# Patient Record
Sex: Female | Born: 1960
Health system: Southern US, Community
[De-identification: ages and names within clinical notes are randomized; demographics above are authoritative.]

## PROBLEM LIST (undated history)

## (undated) DIAGNOSIS — D649 Anemia, unspecified: Secondary | ICD-10-CM

## (undated) DIAGNOSIS — G8929 Other chronic pain: Secondary | ICD-10-CM

## (undated) DIAGNOSIS — F419 Anxiety disorder, unspecified: Secondary | ICD-10-CM

## (undated) DIAGNOSIS — H409 Unspecified glaucoma: Secondary | ICD-10-CM

## (undated) DIAGNOSIS — K5900 Constipation, unspecified: Secondary | ICD-10-CM

## (undated) DIAGNOSIS — M533 Sacrococcygeal disorders, not elsewhere classified: Secondary | ICD-10-CM

## (undated) DIAGNOSIS — Z789 Other specified health status: Secondary | ICD-10-CM

## (undated) DIAGNOSIS — K219 Gastro-esophageal reflux disease without esophagitis: Secondary | ICD-10-CM

## (undated) DIAGNOSIS — M549 Dorsalgia, unspecified: Secondary | ICD-10-CM

## (undated) DIAGNOSIS — I10 Essential (primary) hypertension: Secondary | ICD-10-CM

## (undated) DIAGNOSIS — R600 Localized edema: Secondary | ICD-10-CM

## (undated) DIAGNOSIS — R350 Frequency of micturition: Secondary | ICD-10-CM

## (undated) DIAGNOSIS — R200 Anesthesia of skin: Secondary | ICD-10-CM

## (undated) DIAGNOSIS — N979 Female infertility, unspecified: Secondary | ICD-10-CM

## (undated) DIAGNOSIS — M25551 Pain in right hip: Secondary | ICD-10-CM

## (undated) HISTORY — DX: Anemia, unspecified: D64.9

## (undated) HISTORY — DX: Sacrococcygeal disorders, not elsewhere classified: M53.3

## (undated) HISTORY — DX: Unspecified glaucoma: H40.9

## (undated) HISTORY — DX: Gastro-esophageal reflux disease without esophagitis: K21.9

## (undated) HISTORY — DX: Essential (primary) hypertension: I10

## (undated) HISTORY — DX: Other chronic pain: G89.29

## (undated) HISTORY — DX: Localized edema: R60.0

## (undated) HISTORY — DX: Frequency of micturition: R35.0

## (undated) HISTORY — DX: Anxiety disorder, unspecified: F41.9

## (undated) HISTORY — PX: DIAGNOSTIC LAPAROSCOPY: SUR761

## (undated) HISTORY — DX: Anesthesia of skin: R20.0

## (undated) HISTORY — PX: WISDOM TOOTH EXTRACTION: SHX21

## (undated) HISTORY — DX: Female infertility, unspecified: N97.9

## (undated) HISTORY — PX: CATARACT EXTRACTION: SUR2

## (undated) HISTORY — DX: Constipation, unspecified: K59.00

## (undated) HISTORY — DX: Pain in right hip: M25.551

---

## 1998-10-12 ENCOUNTER — Encounter: Admission: RE | Admit: 1998-10-12 | Discharge: 1998-10-12 | Payer: Self-pay | Admitting: *Deleted

## 1999-11-02 ENCOUNTER — Other Ambulatory Visit: Admission: RE | Admit: 1999-11-02 | Discharge: 1999-11-02 | Payer: Self-pay | Admitting: Obstetrics and Gynecology

## 1999-12-04 ENCOUNTER — Emergency Department (HOSPITAL_COMMUNITY): Admission: EM | Admit: 1999-12-04 | Discharge: 1999-12-04 | Payer: Self-pay | Admitting: Emergency Medicine

## 2001-03-19 ENCOUNTER — Other Ambulatory Visit: Admission: RE | Admit: 2001-03-19 | Discharge: 2001-03-19 | Payer: Self-pay | Admitting: Obstetrics and Gynecology

## 2001-05-30 ENCOUNTER — Other Ambulatory Visit: Admission: RE | Admit: 2001-05-30 | Discharge: 2001-05-30 | Payer: Self-pay | Admitting: Obstetrics and Gynecology

## 2001-05-30 ENCOUNTER — Encounter (INDEPENDENT_AMBULATORY_CARE_PROVIDER_SITE_OTHER): Payer: Self-pay

## 2001-11-12 ENCOUNTER — Other Ambulatory Visit: Admission: RE | Admit: 2001-11-12 | Discharge: 2001-11-12 | Payer: Self-pay | Admitting: Obstetrics and Gynecology

## 2002-03-25 ENCOUNTER — Other Ambulatory Visit: Admission: RE | Admit: 2002-03-25 | Discharge: 2002-03-25 | Payer: Self-pay | Admitting: Obstetrics and Gynecology

## 2002-09-07 ENCOUNTER — Encounter: Payer: Self-pay | Admitting: Obstetrics and Gynecology

## 2002-09-07 ENCOUNTER — Encounter: Admission: RE | Admit: 2002-09-07 | Discharge: 2002-09-07 | Payer: Self-pay | Admitting: Obstetrics and Gynecology

## 2002-09-07 ENCOUNTER — Other Ambulatory Visit: Admission: RE | Admit: 2002-09-07 | Discharge: 2002-09-07 | Payer: Self-pay | Admitting: Obstetrics and Gynecology

## 2003-04-23 ENCOUNTER — Other Ambulatory Visit: Admission: RE | Admit: 2003-04-23 | Discharge: 2003-04-23 | Payer: Self-pay | Admitting: Obstetrics and Gynecology

## 2003-10-08 ENCOUNTER — Encounter: Payer: Self-pay | Admitting: Obstetrics and Gynecology

## 2003-10-08 ENCOUNTER — Encounter: Admission: RE | Admit: 2003-10-08 | Discharge: 2003-10-08 | Payer: Self-pay | Admitting: Obstetrics and Gynecology

## 2004-02-16 ENCOUNTER — Other Ambulatory Visit: Admission: RE | Admit: 2004-02-16 | Discharge: 2004-02-16 | Payer: Self-pay | Admitting: Obstetrics and Gynecology

## 2004-04-07 ENCOUNTER — Other Ambulatory Visit: Admission: RE | Admit: 2004-04-07 | Discharge: 2004-04-07 | Payer: Self-pay | Admitting: Obstetrics and Gynecology

## 2004-10-09 ENCOUNTER — Encounter: Admission: RE | Admit: 2004-10-09 | Discharge: 2004-10-09 | Payer: Self-pay | Admitting: Obstetrics and Gynecology

## 2005-11-09 ENCOUNTER — Encounter: Admission: RE | Admit: 2005-11-09 | Discharge: 2005-11-09 | Payer: Self-pay | Admitting: Obstetrics and Gynecology

## 2006-11-11 ENCOUNTER — Encounter: Admission: RE | Admit: 2006-11-11 | Discharge: 2006-11-11 | Payer: Self-pay | Admitting: Obstetrics and Gynecology

## 2008-01-07 ENCOUNTER — Encounter: Admission: RE | Admit: 2008-01-07 | Discharge: 2008-01-07 | Payer: Self-pay | Admitting: Obstetrics and Gynecology

## 2009-01-07 ENCOUNTER — Encounter: Admission: RE | Admit: 2009-01-07 | Discharge: 2009-01-07 | Payer: Self-pay | Admitting: Obstetrics and Gynecology

## 2009-12-31 HISTORY — PX: DILATION AND CURETTAGE OF UTERUS: SHX78

## 2010-01-09 ENCOUNTER — Encounter: Admission: RE | Admit: 2010-01-09 | Discharge: 2010-01-09 | Payer: Self-pay | Admitting: Obstetrics and Gynecology

## 2010-08-10 ENCOUNTER — Ambulatory Visit (HOSPITAL_COMMUNITY): Admission: RE | Admit: 2010-08-10 | Discharge: 2010-08-10 | Payer: Self-pay | Admitting: Obstetrics and Gynecology

## 2011-01-11 ENCOUNTER — Encounter
Admission: RE | Admit: 2011-01-11 | Discharge: 2011-01-11 | Payer: Self-pay | Source: Home / Self Care | Attending: Obstetrics and Gynecology | Admitting: Obstetrics and Gynecology

## 2011-01-21 ENCOUNTER — Encounter: Payer: Self-pay | Admitting: Obstetrics and Gynecology

## 2011-03-16 LAB — PREGNANCY, URINE

## 2011-03-16 LAB — CBC
HCT: 38.6 % (ref 36.0–46.0)
Hemoglobin: 13 g/dL (ref 12.0–15.0)
MCHC: 33.6 g/dL (ref 30.0–36.0)
RBC: 4.22 MIL/uL (ref 3.87–5.11)
WBC: 6.6 10*3/uL (ref 4.0–10.5)

## 2012-01-07 ENCOUNTER — Other Ambulatory Visit: Payer: Self-pay | Admitting: Obstetrics and Gynecology

## 2012-01-07 DIAGNOSIS — Z1231 Encounter for screening mammogram for malignant neoplasm of breast: Secondary | ICD-10-CM

## 2012-01-23 ENCOUNTER — Ambulatory Visit
Admission: RE | Admit: 2012-01-23 | Discharge: 2012-01-23 | Disposition: A | Discharge status: Home / Self Care | Payer: BC Managed Care – PPO | Source: Ambulatory Visit | Attending: Obstetrics and Gynecology | Admitting: Obstetrics and Gynecology

## 2012-01-23 DIAGNOSIS — Z1231 Encounter for screening mammogram for malignant neoplasm of breast: Secondary | ICD-10-CM

## 2012-12-30 ENCOUNTER — Other Ambulatory Visit: Payer: Self-pay | Admitting: Obstetrics and Gynecology

## 2012-12-30 DIAGNOSIS — Z1231 Encounter for screening mammogram for malignant neoplasm of breast: Secondary | ICD-10-CM

## 2013-01-01 ENCOUNTER — Encounter (HOSPITAL_COMMUNITY): Payer: Self-pay

## 2013-01-01 ENCOUNTER — Encounter (HOSPITAL_COMMUNITY)
Admission: RE | Admit: 2013-01-01 | Discharge: 2013-01-01 | Disposition: A | Discharge status: Home / Self Care | Payer: BC Managed Care – PPO | Source: Ambulatory Visit | Attending: Obstetrics and Gynecology | Admitting: Obstetrics and Gynecology

## 2013-01-01 HISTORY — DX: Other specified health status: Z78.9

## 2013-01-01 LAB — SURGICAL PCR SCREEN
MRSA, PCR: NEGATIVE
Staphylococcus aureus: NEGATIVE

## 2013-01-01 LAB — CBC
MCH: 30.1 pg (ref 26.0–34.0)
MCHC: 32.3 g/dL (ref 30.0–36.0)
Platelets: 191 10*3/uL (ref 150–400)
RBC: 4.09 MIL/uL (ref 3.87–5.11)

## 2013-01-01 NOTE — Patient Instructions (Addendum)
   Your procedure is scheduled WU:[id number] January 9th  Enter through the Main Entrance of [facility] at:11:30am Pick up the phone at the desk and dial [phone] and inform us of your arrival.  Please call this number if you have any problems the morning of surgery: [phone]  Remember: Do not eat food after midnight on Wednesday You may have clear liquids until 9am on Thursday then nothing   Do not wear jewelry, make-up, or FINGER nail polish No metal in your hair or on your body. Do not wear lotions, powders, perfumes. You may wear deodorant.  Please use your CHG wash as directed prior to surgery.  Do not shave anywhere for at least 12 hours prior to first CHG shower.  Do not bring valuables to the hospital. Contacts may not be worn into surgery.  Leave suitcase in the car. After Surgery it may be brought to your room. For patients being admitted to the hospital, checkout time is 11:00am the day of discharge.  Patients discharged on the day of surgery will not be allowed to drive home.

## 2013-01-02 ENCOUNTER — Other Ambulatory Visit: Payer: Self-pay | Admitting: Obstetrics and Gynecology

## 2013-01-07 MED ORDER — CLINDAMYCIN PHOSPHATE 900 MG/50ML IV SOLN
900.0000 mg | INTRAVENOUS | Status: AC
  Administered 2013-01-08: 900 mg via INTRAVENOUS
  Filled 2013-01-07: qty 50

## 2013-01-07 MED ORDER — CIPROFLOXACIN IN D5W 400 MG/200ML IV SOLN
400.0000 mg | INTRAVENOUS | Status: AC
  Administered 2013-01-08: 400 mg via INTRAVENOUS
  Filled 2013-01-07: qty 200

## 2013-01-08 ENCOUNTER — Encounter (HOSPITAL_COMMUNITY): Payer: Self-pay | Admitting: Anesthesiology

## 2013-01-08 ENCOUNTER — Encounter (HOSPITAL_COMMUNITY): Payer: Self-pay

## 2013-01-08 ENCOUNTER — Encounter (HOSPITAL_COMMUNITY)
Admission: RE | Disposition: A | Discharge status: Home / Self Care | Payer: Self-pay | Source: Ambulatory Visit | Attending: Obstetrics and Gynecology

## 2013-01-08 ENCOUNTER — Ambulatory Visit (HOSPITAL_COMMUNITY): Payer: BC Managed Care – PPO | Admitting: Anesthesiology

## 2013-01-08 ENCOUNTER — Ambulatory Visit (HOSPITAL_COMMUNITY)
Admission: RE | Admit: 2013-01-08 | Discharge: 2013-01-09 | Disposition: A | Discharge status: Home / Self Care | Payer: BC Managed Care – PPO | Source: Ambulatory Visit | Attending: Obstetrics and Gynecology | Admitting: Obstetrics and Gynecology

## 2013-01-08 DIAGNOSIS — N8000 Endometriosis of the uterus, unspecified: Secondary | ICD-10-CM | POA: Insufficient documentation

## 2013-01-08 DIAGNOSIS — IMO0002 Reserved for concepts with insufficient information to code with codable children: Secondary | ICD-10-CM | POA: Diagnosis present

## 2013-01-08 DIAGNOSIS — Z90710 Acquired absence of both cervix and uterus: Secondary | ICD-10-CM

## 2013-01-08 DIAGNOSIS — D252 Subserosal leiomyoma of uterus: Secondary | ICD-10-CM | POA: Insufficient documentation

## 2013-01-08 DIAGNOSIS — D251 Intramural leiomyoma of uterus: Secondary | ICD-10-CM | POA: Insufficient documentation

## 2013-01-08 DIAGNOSIS — N920 Excessive and frequent menstruation with regular cycle: Secondary | ICD-10-CM | POA: Insufficient documentation

## 2013-01-08 HISTORY — PX: ROBOTIC ASSISTED TOTAL HYSTERECTOMY: SHX6085

## 2013-01-08 HISTORY — PX: BILATERAL SALPINGECTOMY: SHX5743

## 2013-01-08 LAB — BASIC METABOLIC PANEL
CO2: 24 mEq/L (ref 19–32)
Calcium: 9.4 mg/dL (ref 8.4–10.5)
Creatinine, Ser: 0.85 mg/dL (ref 0.50–1.10)
GFR calc non Af Amer: 79 mL/min — ABNORMAL LOW (ref >90)

## 2013-01-08 SURGERY — ROBOTIC ASSISTED TOTAL HYSTERECTOMY
Anesthesia: General | Site: Abdomen | Wound class: Clean Contaminated

## 2013-01-08 MED ORDER — LACTATED RINGERS IR SOLN
Status: DC | PRN
  Administered 2013-01-08: 3000 mL

## 2013-01-08 MED ORDER — ONDANSETRON HCL 4 MG/2ML IJ SOLN
4.0000 mg | Freq: Four times a day (QID) | INTRAMUSCULAR | Status: DC | PRN
  Administered 2013-01-08: 4 mg via INTRAVENOUS

## 2013-01-08 MED ORDER — BUPIVACAINE HCL (PF) 0.25 % IJ SOLN
INTRAMUSCULAR | Status: AC
  Filled 2013-01-08: qty 30

## 2013-01-08 MED ORDER — ARTIFICIAL TEARS OP OINT
TOPICAL_OINTMENT | OPHTHALMIC | Status: AC
  Filled 2013-01-08: qty 3.5

## 2013-01-08 MED ORDER — NALOXONE HCL 0.4 MG/ML IJ SOLN: 0.4000 mg | INTRAMUSCULAR | Status: DC | PRN

## 2013-01-08 MED ORDER — ROCURONIUM BROMIDE 100 MG/10ML IV SOLN
INTRAVENOUS | Status: DC | PRN
  Administered 2013-01-08: 50 mg via INTRAVENOUS
  Administered 2013-01-08: 20 mg via INTRAVENOUS
  Administered 2013-01-08: 5 mg via INTRAVENOUS

## 2013-01-08 MED ORDER — OXYCODONE-ACETAMINOPHEN 5-325 MG PO TABS: 1.0000 | ORAL_TABLET | ORAL | Status: DC | PRN

## 2013-01-08 MED ORDER — LIDOCAINE HCL (CARDIAC) 20 MG/ML IV SOLN
INTRAVENOUS | Status: AC
  Filled 2013-01-08: qty 5

## 2013-01-08 MED ORDER — MENTHOL 3 MG MT LOZG
1.0000 | LOZENGE | OROMUCOSAL | Status: DC | PRN
  Filled 2013-01-08: qty 9

## 2013-01-08 MED ORDER — HYDROMORPHONE 0.3 MG/ML IV SOLN
INTRAVENOUS | Status: DC
  Administered 2013-01-08: 20:00:00 via INTRAVENOUS
  Administered 2013-01-08: 0.2 mg via INTRAVENOUS
  Administered 2013-01-09: 0.4 mg via INTRAVENOUS
  Administered 2013-01-09: 3.33 mL via INTRAVENOUS
  Administered 2013-01-09: 0.3999 mg via INTRAVENOUS
  Filled 2013-01-08: qty 25

## 2013-01-08 MED ORDER — PROPOFOL 10 MG/ML IV EMUL
INTRAVENOUS | Status: DC | PRN
  Administered 2013-01-08: 100 mg via INTRAVENOUS
  Administered 2013-01-08: 200 mg via INTRAVENOUS

## 2013-01-08 MED ORDER — DEXTROSE IN LACTATED RINGERS 5 % IV SOLN
INTRAVENOUS | Status: DC
  Administered 2013-01-08: 21:00:00 via INTRAVENOUS

## 2013-01-08 MED ORDER — BUPIVACAINE HCL (PF) 0.25 % IJ SOLN
INTRAMUSCULAR | Status: DC | PRN
  Administered 2013-01-08: 10 mL

## 2013-01-08 MED ORDER — HYDROMORPHONE HCL PF 1 MG/ML IJ SOLN: 0.2000 mg | INTRAMUSCULAR | Status: DC | PRN

## 2013-01-08 MED ORDER — MEPERIDINE HCL 25 MG/ML IJ SOLN: 6.2500 mg | INTRAMUSCULAR | Status: DC | PRN

## 2013-01-08 MED ORDER — PROMETHAZINE HCL 25 MG/ML IJ SOLN: 6.2500 mg | INTRAMUSCULAR | Status: DC | PRN

## 2013-01-08 MED ORDER — FENTANYL CITRATE 0.05 MG/ML IJ SOLN: 25.0000 ug | INTRAMUSCULAR | Status: DC | PRN

## 2013-01-08 MED ORDER — ROCURONIUM BROMIDE 50 MG/5ML IV SOLN
INTRAVENOUS | Status: AC
  Filled 2013-01-08: qty 1

## 2013-01-08 MED ORDER — FENTANYL CITRATE 0.05 MG/ML IJ SOLN
INTRAMUSCULAR | Status: AC
  Filled 2013-01-08: qty 5

## 2013-01-08 MED ORDER — CIPROFLOXACIN IN D5W 400 MG/200ML IV SOLN
400.0000 mg | Freq: Two times a day (BID) | INTRAVENOUS | Status: AC
  Administered 2013-01-09: 400 mg via INTRAVENOUS
  Filled 2013-01-08: qty 200

## 2013-01-08 MED ORDER — HYDROMORPHONE HCL PF 1 MG/ML IJ SOLN
INTRAMUSCULAR | Status: AC
  Filled 2013-01-08: qty 1

## 2013-01-08 MED ORDER — ACETAMINOPHEN 10 MG/ML IV SOLN
1000.0000 mg | Freq: Four times a day (QID) | INTRAVENOUS | Status: DC
  Administered 2013-01-08 – 2013-01-09 (×2): 1000 mg via INTRAVENOUS
  Filled 2013-01-08 (×4): qty 100

## 2013-01-08 MED ORDER — ONDANSETRON HCL 4 MG/2ML IJ SOLN
INTRAMUSCULAR | Status: DC | PRN
  Administered 2013-01-08: 4 mg via INTRAVENOUS

## 2013-01-08 MED ORDER — MIDAZOLAM HCL 2 MG/2ML IJ SOLN
INTRAMUSCULAR | Status: AC
  Filled 2013-01-08: qty 2

## 2013-01-08 MED ORDER — ZOLPIDEM TARTRATE 5 MG PO TABS: 5.0000 mg | ORAL_TABLET | Freq: Every evening | ORAL | Status: DC | PRN

## 2013-01-08 MED ORDER — CLINDAMYCIN PHOSPHATE 900 MG/50ML IV SOLN
900.0000 mg | Freq: Three times a day (TID) | INTRAVENOUS | Status: AC
  Administered 2013-01-08: 900 mg via INTRAVENOUS
  Filled 2013-01-08: qty 50

## 2013-01-08 MED ORDER — PROPOFOL 10 MG/ML IV EMUL
INTRAVENOUS | Status: AC
  Filled 2013-01-08: qty 20

## 2013-01-08 MED ORDER — GLYCOPYRROLATE 0.2 MG/ML IJ SOLN
INTRAMUSCULAR | Status: AC
  Filled 2013-01-08: qty 2

## 2013-01-08 MED ORDER — NEOSTIGMINE METHYLSULFATE 1 MG/ML IJ SOLN
INTRAMUSCULAR | Status: DC | PRN
  Administered 2013-01-08: 3 mg via INTRAVENOUS

## 2013-01-08 MED ORDER — DIPHENHYDRAMINE HCL 12.5 MG/5ML PO ELIX: 12.5000 mg | ORAL_SOLUTION | Freq: Four times a day (QID) | ORAL | Status: DC | PRN

## 2013-01-08 MED ORDER — FENTANYL CITRATE 0.05 MG/ML IJ SOLN
INTRAMUSCULAR | Status: DC | PRN
  Administered 2013-01-08: 150 ug via INTRAVENOUS
  Administered 2013-01-08: 100 ug via INTRAVENOUS

## 2013-01-08 MED ORDER — GLYCOPYRROLATE 0.2 MG/ML IJ SOLN
INTRAMUSCULAR | Status: DC | PRN
  Administered 2013-01-08: 0.4 mg via INTRAVENOUS

## 2013-01-08 MED ORDER — LIDOCAINE HCL (CARDIAC) 20 MG/ML IV SOLN
INTRAVENOUS | Status: DC | PRN
  Administered 2013-01-08: 80 mg via INTRAVENOUS

## 2013-01-08 MED ORDER — SODIUM CHLORIDE 0.9 % IJ SOLN: 9.0000 mL | INTRAMUSCULAR | Status: DC | PRN

## 2013-01-08 MED ORDER — MIDAZOLAM HCL 5 MG/5ML IJ SOLN
INTRAMUSCULAR | Status: DC | PRN
  Administered 2013-01-08: 2 mg via INTRAVENOUS

## 2013-01-08 MED ORDER — ONDANSETRON HCL 4 MG PO TABS: 4.0000 mg | ORAL_TABLET | Freq: Four times a day (QID) | ORAL | Status: DC | PRN

## 2013-01-08 MED ORDER — LACTATED RINGERS IV SOLN
INTRAVENOUS | Status: DC
  Administered 2013-01-08 (×3): via INTRAVENOUS

## 2013-01-08 MED ORDER — ACETAMINOPHEN 10 MG/ML IV SOLN
INTRAVENOUS | Status: AC
  Filled 2013-01-08: qty 100

## 2013-01-08 MED ORDER — PANTOPRAZOLE SODIUM 40 MG PO TBEC
40.0000 mg | DELAYED_RELEASE_TABLET | Freq: Every day | ORAL | Status: DC
  Administered 2013-01-09: 40 mg via ORAL
  Filled 2013-01-08 (×3): qty 1

## 2013-01-08 MED ORDER — DIPHENHYDRAMINE HCL 50 MG/ML IJ SOLN: 12.5000 mg | Freq: Four times a day (QID) | INTRAMUSCULAR | Status: DC | PRN

## 2013-01-08 MED ORDER — ACETAMINOPHEN 10 MG/ML IV SOLN
1000.0000 mg | Freq: Four times a day (QID) | INTRAVENOUS | Status: DC
  Administered 2013-01-08: 1000 mg via INTRAVENOUS

## 2013-01-08 MED ORDER — MIDAZOLAM HCL 2 MG/2ML IJ SOLN: 0.5000 mg | Freq: Once | INTRAMUSCULAR | Status: DC | PRN

## 2013-01-08 MED ORDER — HYDROMORPHONE HCL PF 1 MG/ML IJ SOLN
INTRAMUSCULAR | Status: DC | PRN
  Administered 2013-01-08 (×2): 1 mg via INTRAVENOUS

## 2013-01-08 MED ORDER — ONDANSETRON HCL 4 MG/2ML IJ SOLN
INTRAMUSCULAR | Status: AC
Start: 2013-01-08 — End: 2013-01-08
  Filled 2013-01-08: qty 2

## 2013-01-08 MED ORDER — ONDANSETRON HCL 4 MG/2ML IJ SOLN
INTRAMUSCULAR | Status: AC
  Administered 2013-01-08: 4 mg via INTRAVENOUS
  Filled 2013-01-08: qty 2

## 2013-01-08 MED ORDER — NEOSTIGMINE METHYLSULFATE 1 MG/ML IJ SOLN
INTRAMUSCULAR | Status: AC
  Filled 2013-01-08: qty 1

## 2013-01-08 SURGICAL SUPPLY — 64 items
ADH SKN CLS APL DERMABOND .7 (GAUZE/BANDAGES/DRESSINGS) ×2
BAG URINE DRAINAGE (UROLOGICAL SUPPLIES) ×3 IMPLANT
BARRIER ADHS 3X4 INTERCEED (GAUZE/BANDAGES/DRESSINGS) ×2 IMPLANT
BRR ADH 4X3 ABS CNTRL BYND (GAUZE/BANDAGES/DRESSINGS)
CATH FOLEY 3WAY  5CC 16FR (CATHETERS) ×1
CATH FOLEY 3WAY 5CC 16FR (CATHETERS) ×2 IMPLANT
CHLORAPREP W/TINT 26ML (MISCELLANEOUS) ×3 IMPLANT
CLOTH BEACON ORANGE TIMEOUT ST (SAFETY) ×3 IMPLANT
CONT PATH 16OZ SNAP LID 3702 (MISCELLANEOUS) ×3 IMPLANT
COVER MAYO STAND STRL (DRAPES) ×3 IMPLANT
COVER TABLE BACK 60X90 (DRAPES) ×6 IMPLANT
COVER TIP SHEARS 8 DVNC (MISCELLANEOUS) ×2 IMPLANT
COVER TIP SHEARS 8MM DA VINCI (MISCELLANEOUS) ×1
DECANTER SPIKE VIAL GLASS SM (MISCELLANEOUS) ×2 IMPLANT
DERMABOND ADVANCED (GAUZE/BANDAGES/DRESSINGS) ×1
DERMABOND ADVANCED .7 DNX12 (GAUZE/BANDAGES/DRESSINGS) ×2 IMPLANT
DEVICE TROCAR PUNCTURE CLOSURE (ENDOMECHANICALS) IMPLANT
DRAPE HUG U DISPOSABLE (DRAPE) ×3 IMPLANT
DRAPE LG THREE QUARTER DISP (DRAPES) ×6 IMPLANT
DRAPE WARM FLUID 44X44 (DRAPE) ×3 IMPLANT
ELECT REM PT RETURN 9FT ADLT (ELECTROSURGICAL) ×3
ELECTRODE REM PT RTRN 9FT ADLT (ELECTROSURGICAL) ×2 IMPLANT
EVACUATOR SMOKE 8.L (FILTER) ×3 IMPLANT
GAUZE VASELINE 3X9 (GAUZE/BANDAGES/DRESSINGS) IMPLANT
GLOVE BIO SURGEON STRL SZ 6.5 (GLOVE) ×6 IMPLANT
GLOVE BIOGEL PI IND STRL 7.0 (GLOVE) ×6 IMPLANT
GLOVE BIOGEL PI INDICATOR 7.0 (GLOVE) ×3
GOWN STRL REIN XL XLG (GOWN DISPOSABLE) ×18 IMPLANT
KIT ACCESSORY DA VINCI DISP (KITS) ×1
KIT ACCESSORY DVNC DISP (KITS) ×2 IMPLANT
LEGGING LITHOTOMY PAIR STRL (DRAPES) ×3 IMPLANT
NDL INSUFFLATION 14GA 120MM (NEEDLE) ×2 IMPLANT
NEEDLE INSUFFLATION 14GA 120MM (NEEDLE) ×3 IMPLANT
OCCLUDER COLPOPNEUMO (BALLOONS) ×1 IMPLANT
PACK LAVH (CUSTOM PROCEDURE TRAY) ×3 IMPLANT
PAD PREP 24X48 CUFFED NSTRL (MISCELLANEOUS) ×6 IMPLANT
PLUG CATH AND CAP STER (CATHETERS) ×3 IMPLANT
PROTECTOR NERVE ULNAR (MISCELLANEOUS) ×6 IMPLANT
SCISSORS LAP 5X35 DISP (ENDOMECHANICALS) IMPLANT
SET CYSTO W/LG BORE CLAMP LF (SET/KITS/TRAYS/PACK) IMPLANT
SET IRRIG TUBING LAPAROSCOPIC (IRRIGATION / IRRIGATOR) ×3 IMPLANT
SOLUTION ELECTROLUBE (MISCELLANEOUS) ×3 IMPLANT
SUT VIC AB 0 CT1 27 (SUTURE) ×24
SUT VIC AB 0 CT1 27XBRD ANTBC (SUTURE) ×10 IMPLANT
SUT VICRYL 0 UR6 27IN ABS (SUTURE) ×4 IMPLANT
SUT VICRYL 4-0 PS2 18IN ABS (SUTURE) ×6 IMPLANT
SYR 50ML LL SCALE MARK (SYRINGE) ×3 IMPLANT
SYRINGE 10CC LL (SYRINGE) ×3 IMPLANT
SYSTEM CONVERTIBLE TROCAR (TROCAR) IMPLANT
TIP RUMI ORANGE 6.7MMX12CM (TIP) ×1 IMPLANT
TIP UTERINE 5.1X6CM LAV DISP (MISCELLANEOUS) IMPLANT
TIP UTERINE 6.7X10CM GRN DISP (MISCELLANEOUS) IMPLANT
TIP UTERINE 6.7X6CM WHT DISP (MISCELLANEOUS) IMPLANT
TIP UTERINE 6.7X8CM BLUE DISP (MISCELLANEOUS) IMPLANT
TOWEL OR 17X24 6PK STRL BLUE (TOWEL DISPOSABLE) ×9 IMPLANT
TROCAR 12M 150ML BLUNT (TROCAR) IMPLANT
TROCAR DISP BLADELESS 8 DVNC (TROCAR) IMPLANT
TROCAR DISP BLADELESS 8MM (TROCAR) ×1
TROCAR XCEL 12X100 BLDLESS (ENDOMECHANICALS) ×3 IMPLANT
TROCAR XCEL NON-BLD 5MMX100MML (ENDOMECHANICALS) ×3 IMPLANT
TROCAR Z-THREAD 12X150 (TROCAR) ×3 IMPLANT
TUBING FILTER THERMOFLATOR (ELECTROSURGICAL) ×3 IMPLANT
WARMER LAPAROSCOPE (MISCELLANEOUS) ×3 IMPLANT
WATER STERILE IRR 1000ML POUR (IV SOLUTION) ×9 IMPLANT

## 2013-01-08 NOTE — Brief Op Note (Signed)
01/08/2013  6:07 PM  PATIENT:  Veronda Prude  52 y.o. female  PRE-OPERATIVE DIAGNOSIS:  Menorrhagia, Uterine Fibroids. Previous cesarean section  POST-OPERATIVE DIAGNOSIS:  Menorrhagia, fibroids, previous  Cesarean section  PROCEDURE:  DaVinci robotic total hysterectomy, bilateral salpingectomy  SURGEON:  Surgeon(s) and Role:    * Jkayla Spiewak Cathie Beams, MD - Primary  PHYSICIAN ASSISTANT:   ASSISTANTS: Olivia Mackie, M.D   ANESTHESIA:   general FINDINGS: enlarged globular uterus 12-13 weeks size, nl tubes, nl liver edge, ovaries(left w/ CL cyst) nl right ovary  EBL:  Total I/O In: 2000 [I.V.:2000] Out: 550 [Urine:450; Blood:100]  BLOOD ADMINISTERED:none  DRAINS: none   LOCAL MEDICATIONS USED:  MARCAINE     SPECIMEN:  Source of Specimen:  uterus with cervix, tubes bilateral  DISPOSITION OF SPECIMEN:  PATHOLOGY  COUNTS:  YES  TOURNIQUET:  * No tourniquets in log *  DICTATION: .Other Dictation: Dictation Number   PLAN OF CARE: Admit for overnight observation  PATIENT DISPOSITION:  PACU - hemodynamically stable.   Delay start of Pharmacological VTE agent (>24hrs) due to surgical blood loss or risk of bleeding: no

## 2013-01-08 NOTE — Anesthesia Postprocedure Evaluation (Signed)
Anesthesia Post Note  Patient: Tonya Clarke  Procedure(s) Performed: Procedure(s) (LRB): ROBOTIC ASSISTED TOTAL HYSTERECTOMY (N/A) BILATERAL SALPINGECTOMY (Bilateral)  Anesthesia type: General  Patient location: PACU  Post pain: Pain level controlled  Post assessment: Post-op Vital signs reviewed  Last Vitals:  Filed Vitals:   01/08/13 1900  BP:   Pulse: 102  Temp: 36.9 C  Resp: 33    Post vital signs: Reviewed  Level of consciousness: sedated  Complications: No apparent anesthesia complications

## 2013-01-08 NOTE — Transfer of Care (Signed)
Immediate Anesthesia Transfer of Care Note  Patient: Tonya Clarke  Procedure(s) Performed: Procedure(s) (LRB) with comments: ROBOTIC ASSISTED TOTAL HYSTERECTOMY (N/A) - 3 hrs. BILATERAL SALPINGECTOMY (Bilateral)  Patient Location: PACU  Anesthesia Type:General  Level of Consciousness: awake, alert  and oriented  Airway & Oxygen Therapy: Patient Spontanous Breathing and Patient connected to nasal cannula oxygen  Post-op Assessment: Report given to PACU RN and Post -op Vital signs reviewed and stable  Post vital signs: stable  Complications: No apparent anesthesia complications

## 2013-01-08 NOTE — Anesthesia Preprocedure Evaluation (Signed)
Anesthesia Evaluation  Patient identified by MRN, date of birth, ID band Patient awake    Reviewed: Allergy & Precautions, H&P , Patient's Chart, lab work & pertinent test results, reviewed documented beta blocker date and time   History of Anesthesia Complications Negative for: history of anesthetic complications  Airway Mallampati: III TM Distance: >3 FB Neck ROM: full    Dental No notable dental hx.    Pulmonary neg pulmonary ROS,  breath sounds clear to auscultation  Pulmonary exam normal       Cardiovascular Exercise Tolerance: Good negative cardio ROS  Rhythm:regular Rate:Normal     Neuro/Psych negative neurological ROS  negative psych ROS   GI/Hepatic negative GI ROS, Neg liver ROS,   Endo/Other  negative endocrine ROS  Renal/GU negative Renal ROS     Musculoskeletal   Abdominal   Peds  Hematology negative hematology ROS (+)   Anesthesia Other Findings   Reproductive/Obstetrics negative OB ROS                           Anesthesia Physical Anesthesia Plan  ASA: II  Anesthesia Plan: General ETT   Post-op Pain Management:    Induction:   Airway Management Planned:   Additional Equipment:   Intra-op Plan:   Post-operative Plan:   Informed Consent: I have reviewed the patients History and Physical, chart, labs and discussed the procedure including the risks, benefits and alternatives for the proposed anesthesia with the patient or authorized representative who has indicated his/her understanding and acceptance.   Dental Advisory Given  Plan Discussed with: CRNA and Surgeon  Anesthesia Plan Comments:         Anesthesia Quick Evaluation

## 2013-01-09 ENCOUNTER — Encounter (HOSPITAL_COMMUNITY): Payer: Self-pay | Admitting: Obstetrics and Gynecology

## 2013-01-09 DIAGNOSIS — IMO0002 Reserved for concepts with insufficient information to code with codable children: Secondary | ICD-10-CM | POA: Diagnosis present

## 2013-01-09 LAB — CBC
Hemoglobin: 12.3 g/dL (ref 12.0–15.0)
MCH: 29.9 pg (ref 26.0–34.0)
MCHC: 32.6 g/dL (ref 30.0–36.0)
MCV: 91.7 fL (ref 78.0–100.0)

## 2013-01-09 LAB — BASIC METABOLIC PANEL
BUN: 5 mg/dL — ABNORMAL LOW (ref 6–23)
CO2: 27 mEq/L (ref 19–32)
GFR calc non Af Amer: 84 mL/min — ABNORMAL LOW (ref >90)
Glucose, Bld: 130 mg/dL — ABNORMAL HIGH (ref 70–99)
Potassium: 4 mEq/L (ref 3.5–5.1)

## 2013-01-09 MED ORDER — OXYCODONE-ACETAMINOPHEN 5-325 MG PO TABS
1.0000 | ORAL_TABLET | ORAL | Status: DC | PRN
Start: 2013-01-09 — End: 2018-11-03

## 2013-01-09 NOTE — Progress Notes (Signed)
Subjective: Patient reports tolerating PO and no problems voiding.    Objective: I have reviewed patient's vital signs.  vital signs, intake and output and labs. Filed Vitals:   01/09/13 0515  BP: 121/80  Pulse: 74  Temp: 98.4 F (36.9 C)  Resp: 18   I/O last 3 completed shifts: In: 4930 [P.O.:300; I.V.:4430; IV Piggyback:200] Out: 3550 [Urine:3450; Blood:100] Total I/O In: -  Out: 350 [Urine:350]  Lab Results  Component Value Date   WBC 12.5* 01/09/2013   HGB 12.3 01/09/2013   HCT 37.7 01/09/2013   MCV 91.7 01/09/2013   PLT 175 01/09/2013   Lab Results  Component Value Date   CREATININE 0.81 01/09/2013    EXAM General: alert, cooperative and no distress Resp: clear to auscultation bilaterally Cardio: regular rate and rhythm, S1, S2 normal, no murmur, click, rub or gallop GI: soft, non-tender; bowel sounds normal; no masses,  no organomegaly Extremities: no edema, redness or tenderness in the calves or thighs Vaginal Bleeding: none  Assessment: s/p Procedure(s): ROBOTIC ASSISTED TOTAL HYSTERECTOMY BILATERAL SALPINGECTOMY: stable, progressing well and tolerating diet  Plan: Advance diet Encourage ambulation Advance to PO medication Discontinue IV fluids Discharge home D/c instructions reviewed  LOS: 1 day    Erilyn Pearman A, MD 01/09/2013 8:56 AM    01/09/2013, 8:56 AM

## 2013-01-09 NOTE — Addendum Note (Signed)
Addendum  created 01/09/13 0836 by Earmon Phoenix, CRNA   Modules edited:Notes Section

## 2013-01-09 NOTE — Anesthesia Postprocedure Evaluation (Signed)
  Anesthesia Post-op Note  Patient: Tonya Clarke  Procedure(s) Performed: Procedure(s) (LRB) with comments: ROBOTIC ASSISTED TOTAL HYSTERECTOMY (N/A) - 3 hrs. BILATERAL SALPINGECTOMY (Bilateral)  Patient Location: [facility]  Anesthesia Type:General  Level of Consciousness: awake, alert  and oriented  Airway and Oxygen Therapy: Patient Spontanous Breathing  Post-op Pain: mild  Post-op Assessment: Patient's Cardiovascular Status Stable, Respiratory Function Stable, No signs of Nausea or vomiting and Pain level controlled  Post-op Vital Signs: stable  Complications: No apparent anesthesia complications

## 2013-01-09 NOTE — Discharge Summary (Signed)
Physician Discharge Summary  Patient ID: RIVER MCKERCHER MRN: [id number] DOB/AGE: 1961-02-20 52 y.o.  Admit date: 01/08/2013 Discharge date: 01/09/2013  Admission Diagnoses: menorrhagia, uterine fibroid, previous cesarean section  Discharge Diagnoses: menorrhagia, uterine fibroid, previous cesarean section Active Problems:  * No active hospital problems. *    Discharged Condition: stable  Hospital Course: pt was admitted to [facility] where she underwent daVinci robotic total hysterectomy, bilateral salpingectomy. Uncomplicated postop course. Tolerated reg diet  Consults: None  Significant Diagnostic Studies: labs: hgb 12.5    Creatinine 0.81 wbc 12.3  Treatments: surgery: daVinci robotic total hysterectomy, bilateral salpingectomy  Discharge Exam: Blood pressure 121/80, pulse 74, temperature 98.4 F (36.9 C), temperature source Oral, resp. rate 18, height 5\' 2"  (1.575 m), weight 87.091 kg (192 lb), SpO2 100.00%. General appearance: alert, cooperative and no distress Resp: clear to auscultation bilaterally Cardio: regular rate and rhythm, S1, S2 normal, no murmur, click, rub or gallop GI: soft, non-tender; bowel sounds normal; no masses,  no organomegaly Pelvic: deferred Extremities: no edema, redness or tenderness in the calves or thighs Incision/Wound:robotic incisions well approximated  Disposition: Final discharge disposition not confirmed  Discharge Orders    Future Appointments: Provider: Department: Dept Phone: Center:   02/20/2013 8:00 AM [facility]  IMAGING [phone] [facility]     Future Orders Please Complete By Expires   Diet general      Increase activity slowly      Discharge instructions      Comments:   Call if temperature greater than equal to 100.4, nothing per vagina for 4-6 weeks or severe nausea vomiting, increased incisional pain , drainage or redness in the incision site, no straining with bowel movements, showers no bath     No wound care      No dressing needed      Discharge patient          Medication List     As of 01/09/2013  8:59 AM    TAKE these medications         acetaminophen 500 MG tablet   Commonly known as: TYLENOL   Take 500 mg by mouth every 6 (six) hours as needed. pain      oxyCODONE-acetaminophen 5-325 MG per tablet   Commonly known as: PERCOCET/ROXICET   Take 1-2 tablets by mouth every 4 (four) hours as needed (moderate to severe pain (when tolerating fluids)).           Follow-up Information    Follow up with Michell Giuliano A, MD. In 2 weeks.   Contact information:   [address] [place] [address] [phone]          Signed: Monchel Pollitt A 01/09/2013, 8:59 AM

## 2013-01-09 NOTE — Op Note (Signed)
Tonya Clarke, Tonya Clarke            ACCOUNT NO.:  [id number]  MEDICAL RECORD NO.:  [id number]  LOCATION:                                 FACILITY:  PHYSICIAN:  Maxie Better, M.D.DATE OF BIRTH:  Dec 02, 1961  DATE OF PROCEDURE:  01/08/2013 DATE OF DISCHARGE:                              OPERATIVE REPORT   PREOPERATIVE DIAGNOSES:  Menorrhagia, uterine fibroids, previous cesarean section.  PROCEDURE:  Da Vinci robotic total hysterectomy, bilateral salpingectomy.  POSTOPERATIVE DIAGNOSES:  Menorrhagia, uterine fibroids. Previous cesarean section.  ANESTHESIA:  General.  SURGEON:  Maxie Better, M.D.  ASSISTANT:  Lenoard Aden, M.D.  PROCEDURE:  Under adequate general anesthesia, the patient was placed in the dorsal lithotomy position.  She was positioned for robotic surgery. Examination under anesthesia revealed a 12-13 week size uterus.  No adnexal masses could be appreciated.  There was a palpable posterior cul- de-sac mass associated with the uterus.  The patient was sterilely prepped and draped in fashion, three-way Foley catheter was sterilely placed.  Retractor  was placed in the vagina.  Cervix had 0 Vicryl figure-of-eight sutures placed on the anterior lip and the posterior lip of the cervix.  The cervix was dilated and uterus sounded to 13 cm.  A #12 uterine manipulator attached to a small KOH ring, Rumi applicator was inserted into the uterus without incident.  The retractors were removed.  Attention was then turned to the abdomen.  A 0.25% Marcaine was injected supraumbilically as  a vertical incision was made.  Veress needle was introduced, tested with normal saline.  Carbon dioxide was insufflated.  Opening pressure of 7 was noted, 2.5 L of CO2 was insufflated.  Veress needle was then removed.  A 12-mm disposable trocar with sleeve was introduced into the abdomen without incident. The robotic camera was introduced.  The patient was placed in  deep Trendelenburg position.  The panoramic inspection noted normal liver edge, atraumatic entry into the abdomen and the uterus that was enlarged, left ovary with corpus luteum appearing cyst.  Right ovary was normal and normal tubes.  Additional robotic port sites were placed, 2 on the left, 10 cm apart, both  8 mm ports on the left.  Right side had a 5- mm assistant port placed in the right lower quadrant and 8-mm robotic ports sites placed in between the robotic camera site and the assistant port.  Once these were placed satisfactorily, the robot was brought to the patient's left side and docked.  The arm one had the monopolar scissors, arm two had the PK dissector and arm 2 had the Prograsp placement.  I then went to the surgical console.  At the surgical console, the hysterectomy was started with the ureters being both seen peristalsing. On the right side.  The retroperitoneal space was opened.  The fallopian tube mesosalpinx was serially clamped, cauterized and cut.  The right utero-ovarian ligament was clamped, cauterized and then cut.  The round ligament was then clamped, cauterized and cut.  The anterior posterior leaf of the broad ligament was then opened and extended towards the bladder flap. The bladder flap was then opened transversely and the bladder was then dissected off the lower uterine segment.  The uterine vessels on the right was skeletonized, clamped, cauterized, but not cut.  Attention was then turned to the opposite side.  The ureter was noted to be peristalsing.  The left retroperitoneal space was opened.  The left fallopian tube was grasped, the mesosalpinx was serially clamped, cauterized, and cut.  The utero-ovarian ligament on the left had some bleeding started which was subsequently clamped and cauterized. Hemostasis was achieved.  The utero-ovarian ligament was severed, the round ligament was then also clamped, cauterized and cut, and the remaining  vesicouterine peritoneum was then opened.  The blood clots were removed.  The uterine vessels were noted on the left and which were cauterized and cut.  Once that was done,  attention was then turned posteriorly, there was poorly developed of the uterosacral ligaments. The  Bladder peritoneum was further moved inferiorly.  Attempt to open the cervicovaginal junction posteriorly was unsuccessful.  Attention then turned back to the right side where the uterine vessels were re- cauterized and cut.  The RUMI was readjusted to make sure that it was right at the cervicovaginal junction.  Once this was done, the anterior incision was then made to sever the uterus from its vaginal attachment.  This incision was then carried around circumferentially taking care again to secure the cup along that junction.  Once this was done, the uterus was removed,  however, the uterus was too large to be brought through the vagina in total, so the uterus was bivalved partially and uterus was then removed through the vagina.  Once this was done, the posterior cuff was noted, small bleeders cauterized.  The PK and the monopolar scissors were replaced by a long-tip forceps and a large needle driver, 0 Vicryl figure-of-eight sutures were then used to close the vaginal cuff.  Once this was done, the cuff was irrigated and suctioned of debris.  The pedicles again were noted to have good hemostasis.  The procedure was felt to be completed at that point, at which time the robotic instruments were removed.  The robot was undocked and then back to the patient's bedside.  The needles from the vaginal cuff closure were then removed.  There was some bleeding noted near the bladder peritoneum and this was cauterized.  The abdomen was then irrigated and  suctioned.  Good hemostasis noted throughout at which time  the robotic port sites were then removed.  The incisions were then closed with 4-0 Vicryl subcuticular stitch of the   small incisions.  The supraumbilical incision had a 0 Vicryl for the fascial closure and 4-0 Vicryl for the skin.  Bimanual vaginal exam was done and the vaginal cuff was well approximated.  SPECIMEN:  Uterus, cervix, fallopian tubes, weighing 221 g sent to pathology.  ESTIMATED BLOOD LOSS:  200 mL.  INTRAOPERATIVE FLUID:  2 L.  URINE OUTPUT:  450 mL clear yellow urine.  COUNTS:  Sponge and instrument counts x2 was correct.  COMPLICATIONS:  None.  The patient tolerated the procedure well, was transferred to recovery in stable condition.     Maxie Better, M.D.     [place]/MEDQ  D:  01/09/2013  T:  01/09/2013  Job:  [id number]

## 2013-01-09 NOTE — Progress Notes (Signed)
Pt  Out in wheelchair   Teaching complete  

## 2013-02-20 ENCOUNTER — Ambulatory Visit
Admission: RE | Admit: 2013-02-20 | Discharge: 2013-02-20 | Disposition: A | Discharge status: Home / Self Care | Payer: BC Managed Care – PPO | Source: Ambulatory Visit | Attending: Obstetrics and Gynecology | Admitting: Obstetrics and Gynecology

## 2013-02-20 ENCOUNTER — Other Ambulatory Visit: Payer: Self-pay | Admitting: Obstetrics and Gynecology

## 2013-02-20 DIAGNOSIS — Z1231 Encounter for screening mammogram for malignant neoplasm of breast: Secondary | ICD-10-CM

## 2013-02-20 DIAGNOSIS — N644 Mastodynia: Secondary | ICD-10-CM

## 2013-03-05 ENCOUNTER — Ambulatory Visit
Admission: RE | Admit: 2013-03-05 | Discharge: 2013-03-05 | Disposition: A | Discharge status: Home / Self Care | Payer: BC Managed Care – PPO | Source: Ambulatory Visit | Attending: Obstetrics and Gynecology | Admitting: Obstetrics and Gynecology

## 2013-03-05 DIAGNOSIS — N644 Mastodynia: Secondary | ICD-10-CM

## 2014-01-27 ENCOUNTER — Other Ambulatory Visit: Payer: Self-pay

## 2014-01-27 DIAGNOSIS — Z1231 Encounter for screening mammogram for malignant neoplasm of breast: Secondary | ICD-10-CM

## 2014-03-08 ENCOUNTER — Ambulatory Visit: Payer: BC Managed Care – PPO

## 2014-03-22 ENCOUNTER — Other Ambulatory Visit: Payer: Self-pay

## 2014-03-22 DIAGNOSIS — Z1231 Encounter for screening mammogram for malignant neoplasm of breast: Secondary | ICD-10-CM

## 2014-03-23 ENCOUNTER — Ambulatory Visit: Payer: BC Managed Care – PPO

## 2014-03-23 ENCOUNTER — Ambulatory Visit
Admission: RE | Admit: 2014-03-23 | Discharge: 2014-03-23 | Disposition: A | Discharge status: Home / Self Care | Payer: BC Managed Care – PPO | Source: Ambulatory Visit

## 2014-03-23 DIAGNOSIS — Z1231 Encounter for screening mammogram for malignant neoplasm of breast: Secondary | ICD-10-CM

## 2014-07-13 ENCOUNTER — Encounter (INDEPENDENT_AMBULATORY_CARE_PROVIDER_SITE_OTHER): Payer: BC Managed Care – PPO | Admitting: Ophthalmology

## 2014-07-13 DIAGNOSIS — H33309 Unspecified retinal break, unspecified eye: Secondary | ICD-10-CM

## 2014-07-13 DIAGNOSIS — H35419 Lattice degeneration of retina, unspecified eye: Secondary | ICD-10-CM

## 2014-07-13 DIAGNOSIS — H43819 Vitreous degeneration, unspecified eye: Secondary | ICD-10-CM

## 2014-07-13 DIAGNOSIS — H2510 Age-related nuclear cataract, unspecified eye: Secondary | ICD-10-CM

## 2014-07-13 DIAGNOSIS — Q143 Congenital malformation of choroid: Secondary | ICD-10-CM

## 2014-07-21 ENCOUNTER — Ambulatory Visit (INDEPENDENT_AMBULATORY_CARE_PROVIDER_SITE_OTHER): Payer: BC Managed Care – PPO | Admitting: Ophthalmology

## 2014-07-21 DIAGNOSIS — H33309 Unspecified retinal break, unspecified eye: Secondary | ICD-10-CM

## 2014-11-22 ENCOUNTER — Ambulatory Visit (INDEPENDENT_AMBULATORY_CARE_PROVIDER_SITE_OTHER): Payer: BC Managed Care – PPO | Admitting: Ophthalmology

## 2014-11-22 DIAGNOSIS — D3131 Benign neoplasm of right choroid: Secondary | ICD-10-CM

## 2014-11-22 DIAGNOSIS — H33302 Unspecified retinal break, left eye: Secondary | ICD-10-CM

## 2014-11-22 DIAGNOSIS — H43813 Vitreous degeneration, bilateral: Secondary | ICD-10-CM

## 2015-02-16 ENCOUNTER — Other Ambulatory Visit: Payer: Self-pay

## 2015-02-16 DIAGNOSIS — Z1231 Encounter for screening mammogram for malignant neoplasm of breast: Secondary | ICD-10-CM

## 2015-03-25 ENCOUNTER — Ambulatory Visit
Admission: RE | Admit: 2015-03-25 | Discharge: 2015-03-25 | Disposition: A | Discharge status: Home / Self Care | Payer: Self-pay | Source: Ambulatory Visit

## 2015-03-25 DIAGNOSIS — Z1231 Encounter for screening mammogram for malignant neoplasm of breast: Secondary | ICD-10-CM

## 2016-02-16 ENCOUNTER — Other Ambulatory Visit: Payer: Self-pay

## 2016-02-16 DIAGNOSIS — Z1231 Encounter for screening mammogram for malignant neoplasm of breast: Secondary | ICD-10-CM

## 2016-03-26 ENCOUNTER — Ambulatory Visit
Admission: RE | Admit: 2016-03-26 | Discharge: 2016-03-26 | Disposition: A | Discharge status: Home / Self Care | Payer: BLUE CROSS/BLUE SHIELD | Source: Ambulatory Visit

## 2016-03-26 DIAGNOSIS — Z1231 Encounter for screening mammogram for malignant neoplasm of breast: Secondary | ICD-10-CM

## 2017-02-22 ENCOUNTER — Other Ambulatory Visit: Payer: Self-pay | Admitting: Obstetrics and Gynecology

## 2017-02-22 DIAGNOSIS — Z1231 Encounter for screening mammogram for malignant neoplasm of breast: Secondary | ICD-10-CM

## 2017-03-27 ENCOUNTER — Ambulatory Visit
Admission: RE | Admit: 2017-03-27 | Discharge: 2017-03-27 | Disposition: A | Discharge status: Home / Self Care | Payer: BLUE CROSS/BLUE SHIELD | Source: Ambulatory Visit | Attending: Obstetrics and Gynecology | Admitting: Obstetrics and Gynecology

## 2017-03-27 DIAGNOSIS — Z1231 Encounter for screening mammogram for malignant neoplasm of breast: Secondary | ICD-10-CM

## 2018-02-24 ENCOUNTER — Other Ambulatory Visit: Payer: Self-pay | Admitting: Obstetrics and Gynecology

## 2018-02-24 DIAGNOSIS — Z1231 Encounter for screening mammogram for malignant neoplasm of breast: Secondary | ICD-10-CM

## 2018-03-31 ENCOUNTER — Ambulatory Visit
Admission: RE | Admit: 2018-03-31 | Discharge: 2018-03-31 | Disposition: A | Discharge status: Home / Self Care | Payer: BLUE CROSS/BLUE SHIELD | Source: Ambulatory Visit | Attending: Obstetrics and Gynecology | Admitting: Obstetrics and Gynecology

## 2018-03-31 DIAGNOSIS — Z1231 Encounter for screening mammogram for malignant neoplasm of breast: Secondary | ICD-10-CM

## 2018-07-10 DIAGNOSIS — I10 Essential (primary) hypertension: Secondary | ICD-10-CM | POA: Diagnosis not present

## 2018-07-30 DIAGNOSIS — H04123 Dry eye syndrome of bilateral lacrimal glands: Secondary | ICD-10-CM | POA: Diagnosis not present

## 2018-07-30 DIAGNOSIS — H401231 Low-tension glaucoma, bilateral, mild stage: Secondary | ICD-10-CM | POA: Diagnosis not present

## 2018-07-30 DIAGNOSIS — H0100A Unspecified blepharitis right eye, upper and lower eyelids: Secondary | ICD-10-CM | POA: Diagnosis not present

## 2018-07-30 DIAGNOSIS — H35412 Lattice degeneration of retina, left eye: Secondary | ICD-10-CM | POA: Diagnosis not present

## 2018-08-18 DIAGNOSIS — Z01419 Encounter for gynecological examination (general) (routine) without abnormal findings: Secondary | ICD-10-CM | POA: Diagnosis not present

## 2018-08-18 DIAGNOSIS — Z6837 Body mass index (BMI) 37.0-37.9, adult: Secondary | ICD-10-CM | POA: Diagnosis not present

## 2018-09-05 DIAGNOSIS — M533 Sacrococcygeal disorders, not elsewhere classified: Secondary | ICD-10-CM | POA: Diagnosis not present

## 2018-09-05 DIAGNOSIS — M87051 Idiopathic aseptic necrosis of right femur: Secondary | ICD-10-CM | POA: Diagnosis not present

## 2018-09-05 DIAGNOSIS — G8929 Other chronic pain: Secondary | ICD-10-CM | POA: Diagnosis not present

## 2018-09-05 DIAGNOSIS — M545 Low back pain: Secondary | ICD-10-CM | POA: Diagnosis not present

## 2018-09-05 DIAGNOSIS — M87052 Idiopathic aseptic necrosis of left femur: Secondary | ICD-10-CM | POA: Diagnosis not present

## 2018-09-14 ENCOUNTER — Emergency Department (HOSPITAL_BASED_OUTPATIENT_CLINIC_OR_DEPARTMENT_OTHER): Payer: BLUE CROSS/BLUE SHIELD

## 2018-09-14 ENCOUNTER — Other Ambulatory Visit: Payer: Self-pay

## 2018-09-14 ENCOUNTER — Emergency Department (HOSPITAL_BASED_OUTPATIENT_CLINIC_OR_DEPARTMENT_OTHER)
Admission: EM | Admit: 2018-09-14 | Discharge: 2018-09-14 | Disposition: A | Discharge status: Home / Self Care | Payer: BLUE CROSS/BLUE SHIELD | Attending: Emergency Medicine | Admitting: Emergency Medicine

## 2018-09-14 ENCOUNTER — Encounter (HOSPITAL_BASED_OUTPATIENT_CLINIC_OR_DEPARTMENT_OTHER): Payer: Self-pay

## 2018-09-14 DIAGNOSIS — M7989 Other specified soft tissue disorders: Secondary | ICD-10-CM | POA: Diagnosis not present

## 2018-09-14 DIAGNOSIS — S8991XA Unspecified injury of right lower leg, initial encounter: Secondary | ICD-10-CM | POA: Diagnosis not present

## 2018-09-14 DIAGNOSIS — M25562 Pain in left knee: Secondary | ICD-10-CM

## 2018-09-14 DIAGNOSIS — Z79899 Other long term (current) drug therapy: Secondary | ICD-10-CM | POA: Diagnosis not present

## 2018-09-14 DIAGNOSIS — Z87891 Personal history of nicotine dependence: Secondary | ICD-10-CM | POA: Insufficient documentation

## 2018-09-14 DIAGNOSIS — M25561 Pain in right knee: Secondary | ICD-10-CM | POA: Insufficient documentation

## 2018-09-14 DIAGNOSIS — S8992XA Unspecified injury of left lower leg, initial encounter: Secondary | ICD-10-CM | POA: Diagnosis not present

## 2018-09-14 HISTORY — DX: Other chronic pain: G89.29

## 2018-09-14 HISTORY — DX: Dorsalgia, unspecified: M54.9

## 2018-09-14 NOTE — ED Provider Notes (Signed)
[facility] EMERGENCY DEPARTMENT Provider Note   CSN: [id number] Arrival date & time: 09/14/18  1547     History   Chief Complaint Chief Complaint  Patient presents with  . Knee Injury    HPI GHAZAL PEVEY is a 57 y.o. female.  HPI Patient is a 57 year old female presents the emergency department with bilateral knee pain.  She fell forward down 3 steps 3 weeks ago and is been having ongoing pain since then despite over-the-counter medication.  She continues to ambulate but has a ginger shuffling gait because of the pain.  She has not seen anyone for evaluation.  She has not had x-rays.  Her pain is mild to moderate and worse with palpation of her anterior knee.   Past Medical History:  Diagnosis Date  . Chronic back pain   . No pertinent past medical history     Patient Active Problem List   Diagnosis Date Noted  . H/O hysterectomy with oophorectomy 01/09/2013    Past Surgical History:  Procedure Laterality Date  . BILATERAL SALPINGECTOMY  01/08/2013   Procedure: BILATERAL SALPINGECTOMY;  Surgeon: Marvene Staff, MD;  Location: [facility];  Service: Gynecology;  Laterality: Bilateral;  . CESAREAN SECTION    . DIAGNOSTIC LAPAROSCOPY  1980's  . DILATION AND CURETTAGE OF UTERUS  2011  . ROBOTIC ASSISTED TOTAL HYSTERECTOMY  01/08/2013   Procedure: ROBOTIC ASSISTED TOTAL HYSTERECTOMY;  Surgeon: Marvene Staff, MD;  Location: [facility];  Service: Gynecology;  Laterality: N/A;  3 hrs.     OB History   None      Home Medications    Prior to Admission medications   Medication Sig Start Date End Date Taking? Authorizing Provider  acetaminophen (TYLENOL) 500 MG tablet Take 500 mg by mouth every 6 (six) hours as needed. pain    [provider]  oxyCODONE-acetaminophen (PERCOCET/ROXICET) 5-325 MG per tablet Take 1-2 tablets by mouth every 4 (four) hours as needed (moderate to severe pain (when tolerating fluids)). 01/09/13   Servando Salina, MD      Family History Family History  Problem Relation Age of Onset  . Breast cancer Cousin     Social History Social History   Tobacco Use  . Smoking status: Former Smoker    Last attempt to quit: 01/01/1995    Years since quitting: 23.7  . Smokeless tobacco: Never Used  Substance Use Topics  . Alcohol use: Yes    Comment: occasional  . Drug use: No     Allergies   Aspirin; Penicillins; Hydrochlorothiazide; Red dye; and Sulfa antibiotics   Review of Systems Review of Systems  All other systems reviewed and are negative.    Physical Exam Updated Vital Signs BP (!) 154/84 (BP Location: Left Arm)   Pulse 78   Temp 98.8 F (37.1 C) (Oral)   Resp 18   Ht 5\' 2"  (1.575 m)   Wt 93.9 kg   LMP 12/19/2012   SpO2 98%   BMI 37.86 kg/m   Physical Exam  Constitutional: She is oriented to person, place, and time. She appears well-developed and well-nourished.  HENT:  Head: Normocephalic.  Eyes: EOM are normal.  Neck: Normal range of motion.  Pulmonary/Chest: Effort normal.  Abdominal: She exhibits no distension.  Musculoskeletal: Normal range of motion.  Full range of motion of bilateral hips, knees, ankles.  Compartments of the lower extremities are soft.  Mild tenderness of the anterior knee bilaterally without bruising or swelling or ecchymosis.  Patellar and quad tendon function is normal bilaterally.  Neurological: She is alert and oriented to person, place, and time.  Psychiatric: She has a normal mood and affect.  Nursing note and vitals reviewed.    ED Treatments / Results  Labs (all labs ordered are listed, but only abnormal results are displayed) Labs Reviewed - No data to display  EKG None  Radiology Dg Knee Complete 4 Views Left  Result Date: 09/14/2018 CLINICAL DATA:  57 y/o F; fall 3 weeks ago onto the knees. Continued pain and swelling of the bilateral knees. Pain is progressively worse today. EXAM: RIGHT KNEE - COMPLETE 4+ VIEW; LEFT KNEE -  COMPLETE 4+ VIEW COMPARISON:  None. FINDINGS: Right knee: No evidence of fracture, dislocation, or joint effusion. No evidence of arthropathy or other focal bone abnormality. Soft tissues are unremarkable. Left knee: No evidence of fracture, dislocation, or joint effusion. No evidence of arthropathy or other focal bone abnormality. Soft tissues are unremarkable. IMPRESSION: No acute fracture or dislocation identified. Electronically Signed   By: Kristine Garbe M.D.   On: 09/14/2018 17:03   Dg Knee Complete 4 Views Right  Result Date: 09/14/2018 CLINICAL DATA:  57 y/o F; fall 3 weeks ago onto the knees. Continued pain and swelling of the bilateral knees. Pain is progressively worse today. EXAM: RIGHT KNEE - COMPLETE 4+ VIEW; LEFT KNEE - COMPLETE 4+ VIEW COMPARISON:  None. FINDINGS: Right knee: No evidence of fracture, dislocation, or joint effusion. No evidence of arthropathy or other focal bone abnormality. Soft tissues are unremarkable. Left knee: No evidence of fracture, dislocation, or joint effusion. No evidence of arthropathy or other focal bone abnormality. Soft tissues are unremarkable. IMPRESSION: No acute fracture or dislocation identified. Electronically Signed   By: Kristine Garbe M.D.   On: 09/14/2018 17:03    Procedures Procedures (including critical care time)  Medications Ordered in ED Medications - No data to display   Initial Impression / Assessment and Plan / ED Course  I have reviewed the triage vital signs and the nursing notes.  Pertinent labs & imaging results that were available during my care of the patient were reviewed by me and considered in my medical decision making (see chart for details).     Ambulatory in the ER.  X-rays without acute osseous abnormality.  Discharged home in good condition for bilateral knee contusion.  Primary care follow-up  Final Clinical Impressions(s) / ED Diagnoses   Final diagnoses:  Acute bilateral knee pain     ED Discharge Orders    None       Jola Schmidt, MD 09/14/18 1722

## 2018-09-14 NOTE — ED Triage Notes (Signed)
Pt states she fell 3 weeks ago and fell onto her knees. Pt c/o pain and swelling to bilateral knees. Pain is progressively worse today.

## 2018-09-15 DIAGNOSIS — G8929 Other chronic pain: Secondary | ICD-10-CM | POA: Diagnosis not present

## 2018-09-15 DIAGNOSIS — M6281 Muscle weakness (generalized): Secondary | ICD-10-CM | POA: Diagnosis not present

## 2018-09-15 DIAGNOSIS — M533 Sacrococcygeal disorders, not elsewhere classified: Secondary | ICD-10-CM | POA: Diagnosis not present

## 2018-09-15 DIAGNOSIS — M545 Low back pain: Secondary | ICD-10-CM | POA: Diagnosis not present

## 2018-09-18 DIAGNOSIS — G8929 Other chronic pain: Secondary | ICD-10-CM | POA: Diagnosis not present

## 2018-09-18 DIAGNOSIS — B029 Zoster without complications: Secondary | ICD-10-CM | POA: Diagnosis not present

## 2018-09-18 DIAGNOSIS — F988 Other specified behavioral and emotional disorders with onset usually occurring in childhood and adolescence: Secondary | ICD-10-CM | POA: Diagnosis not present

## 2018-09-18 DIAGNOSIS — M545 Low back pain: Secondary | ICD-10-CM | POA: Diagnosis not present

## 2018-09-24 DIAGNOSIS — G8929 Other chronic pain: Secondary | ICD-10-CM | POA: Diagnosis not present

## 2018-09-24 DIAGNOSIS — M6281 Muscle weakness (generalized): Secondary | ICD-10-CM | POA: Diagnosis not present

## 2018-09-24 DIAGNOSIS — M545 Low back pain: Secondary | ICD-10-CM | POA: Diagnosis not present

## 2018-09-24 DIAGNOSIS — M533 Sacrococcygeal disorders, not elsewhere classified: Secondary | ICD-10-CM | POA: Diagnosis not present

## 2018-09-30 IMAGING — CR DG KNEE COMPLETE 4+V*L*
4 series · 4 of 4 positions shown · non-contrast
Comparison: None.

CLINICAL DATA: 57 y/o F; fall 3 weeks ago onto the knees. Continued
pain and swelling of the bilateral knees. Pain is progressively
worse today.

EXAM:
RIGHT KNEE - COMPLETE 4+ VIEW; LEFT KNEE - COMPLETE 4+ VIEW

[t knee ap left]
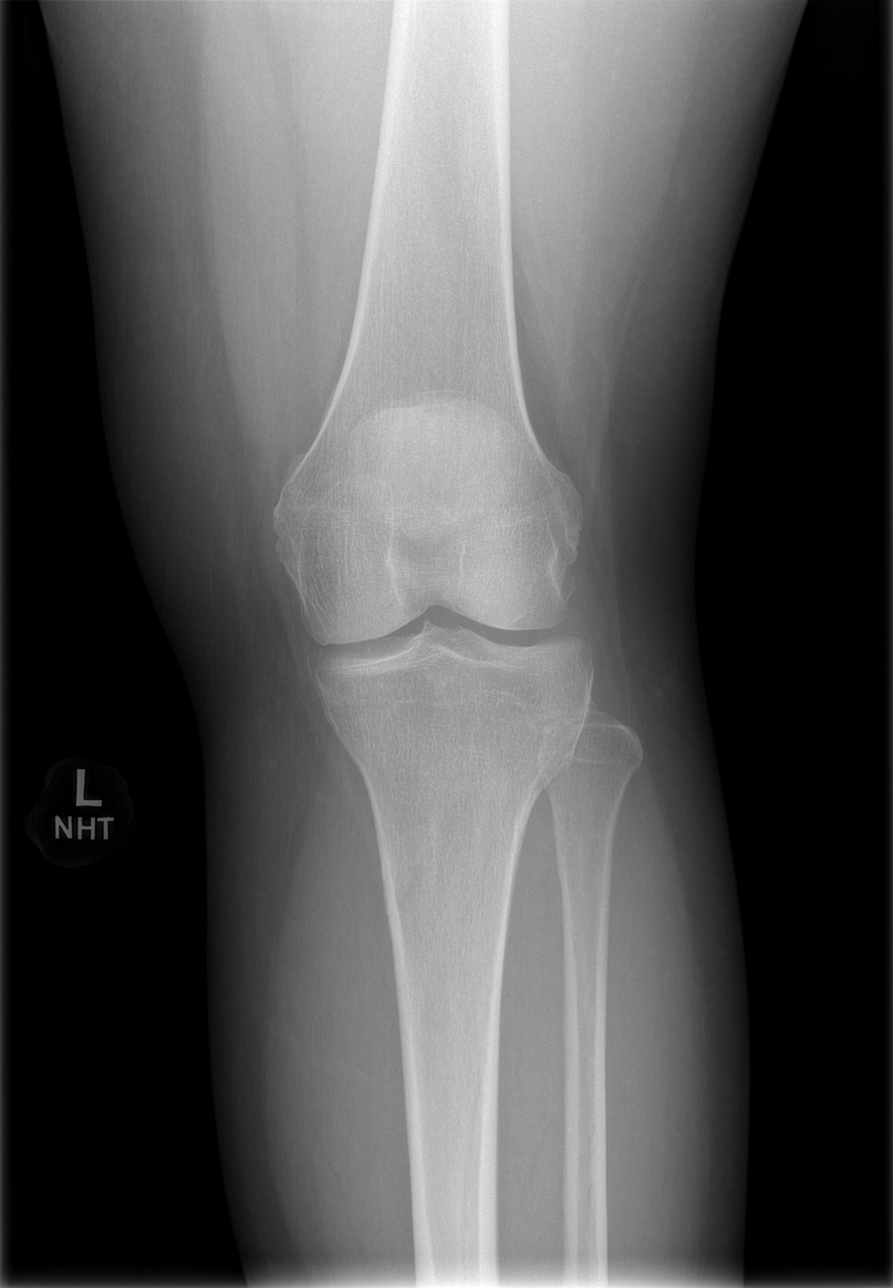

[t knee oblique left (1 of 2)]
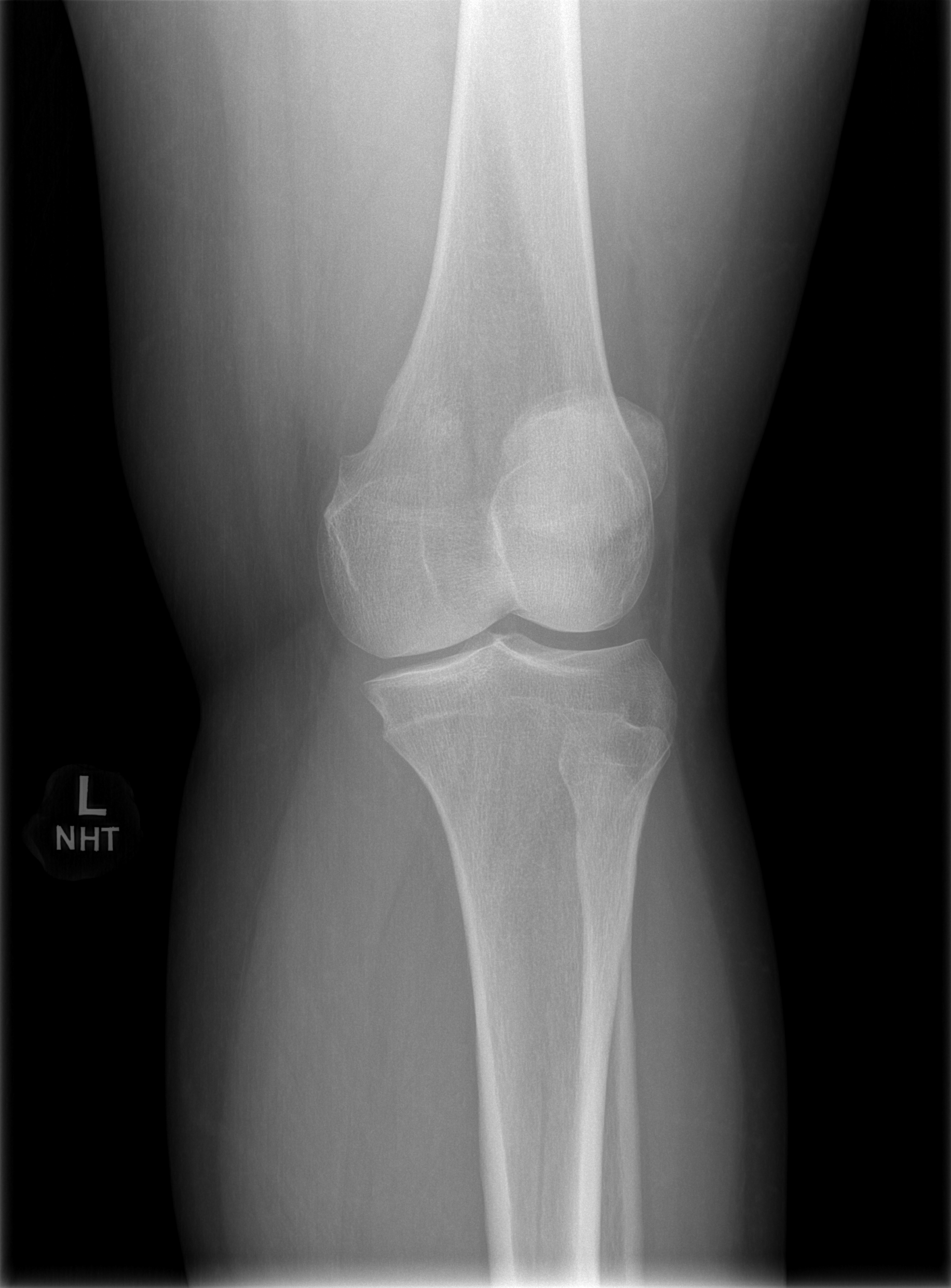

[t knee oblique left (2 of 2)]
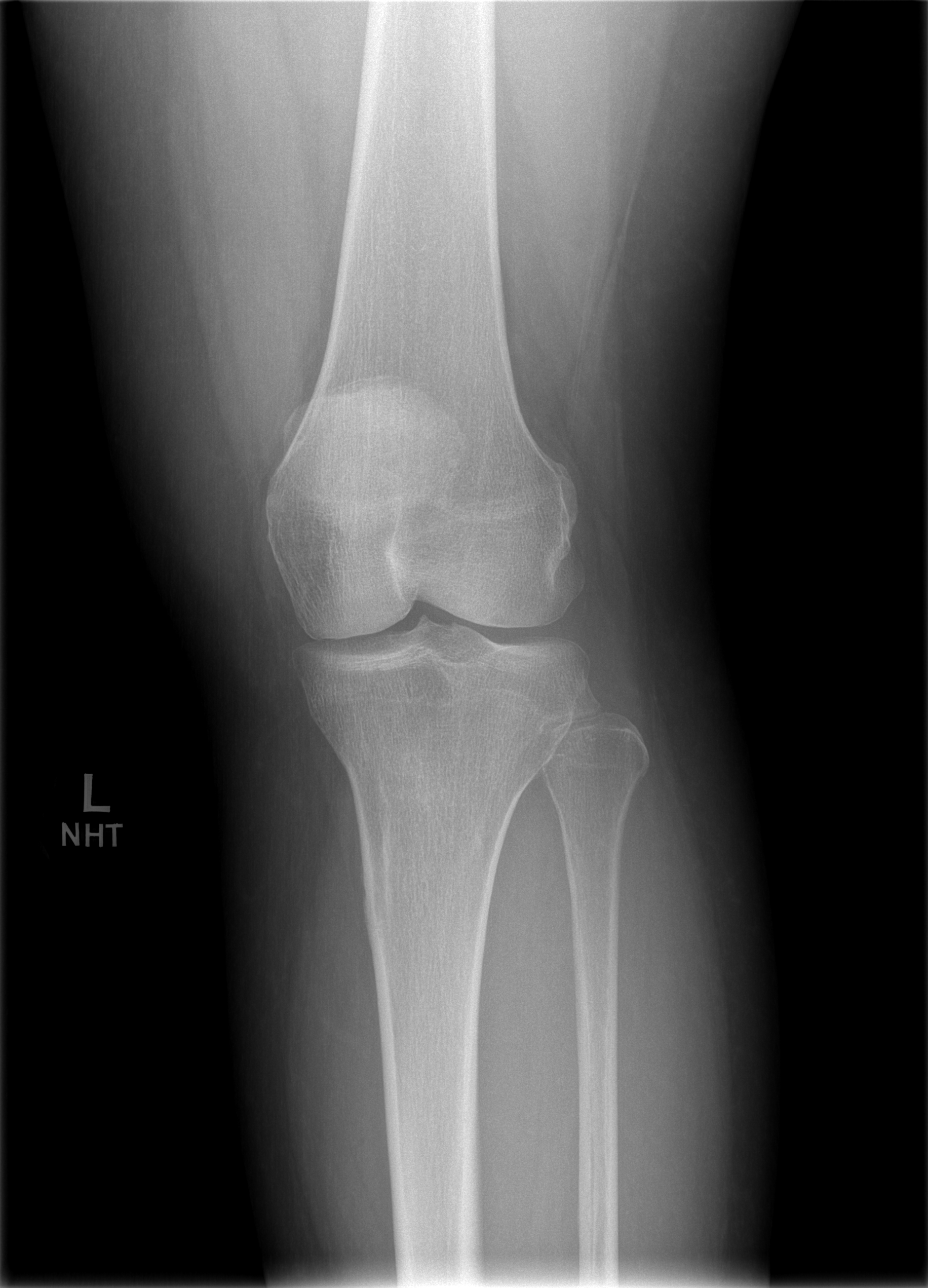

[t knee lat left]
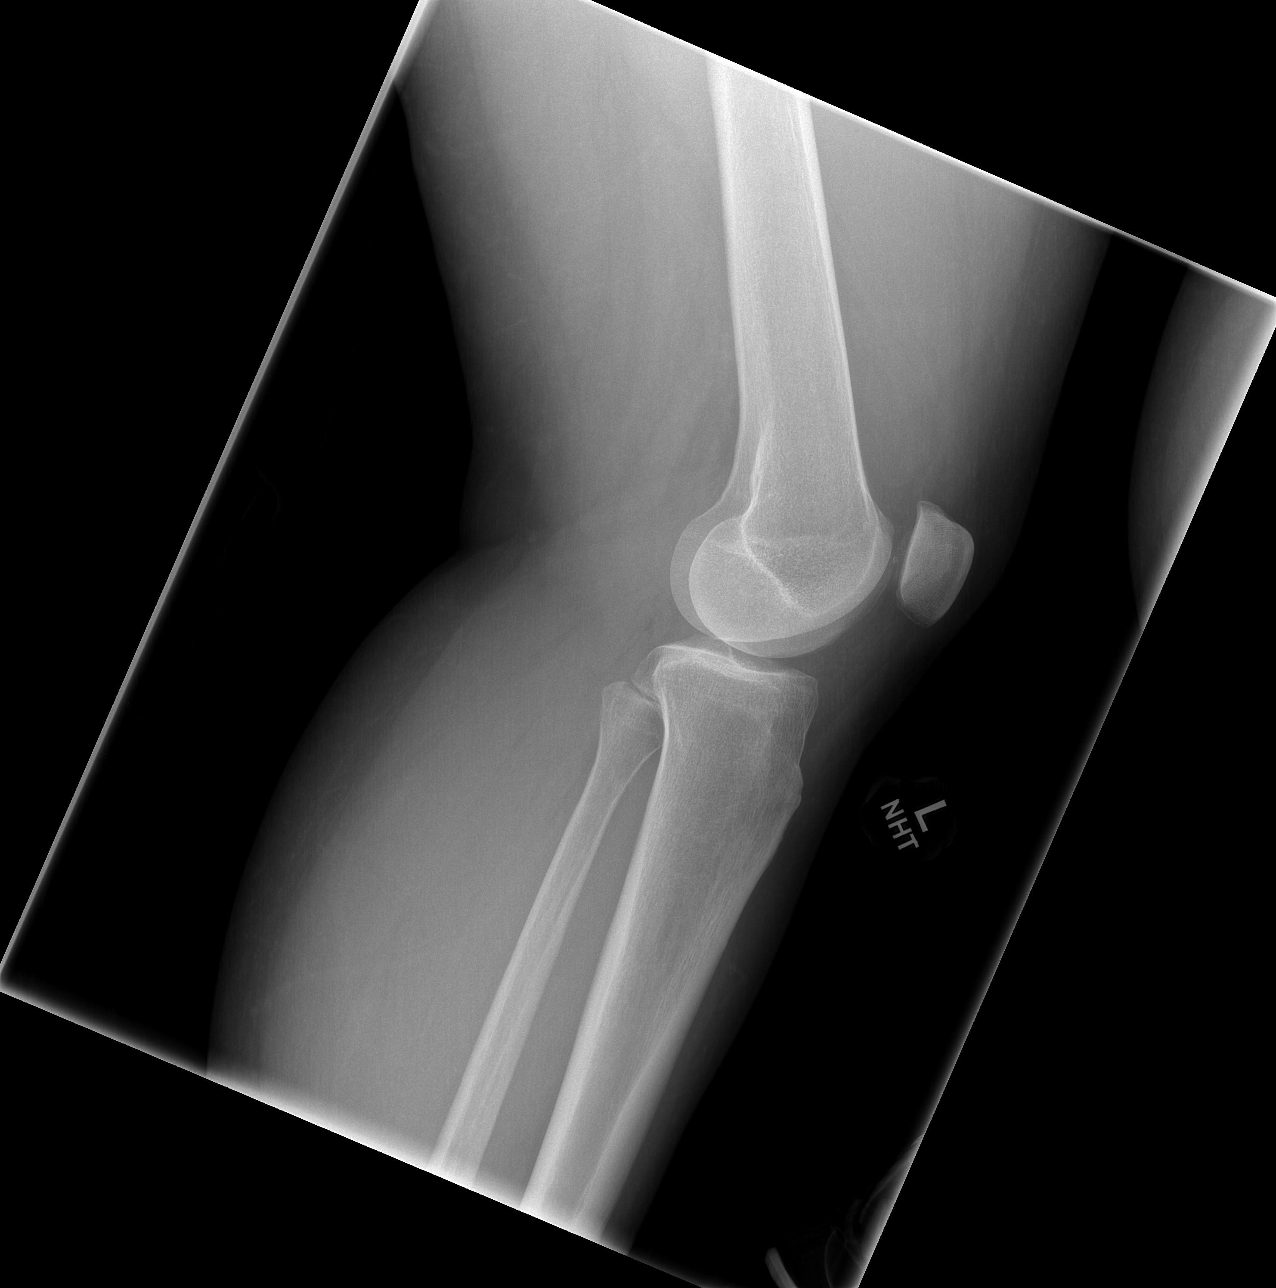

[4 of 4 positions shown; findings below may reference images not displayed]

FINDINGS: Right knee:

No evidence of fracture, dislocation, or joint effusion. No evidence
of arthropathy or other focal bone abnormality. Soft tissues are
unremarkable.

Left knee:

No evidence of fracture, dislocation, or joint effusion. No evidence
of arthropathy or other focal bone abnormality. Soft tissues are
unremarkable.
IMPRESSION: No acute fracture or dislocation identified.

By: Nutsugah Lazio M.D.

## 2018-10-06 DIAGNOSIS — M545 Low back pain: Secondary | ICD-10-CM | POA: Diagnosis not present

## 2018-10-06 DIAGNOSIS — M533 Sacrococcygeal disorders, not elsewhere classified: Secondary | ICD-10-CM | POA: Diagnosis not present

## 2018-10-06 DIAGNOSIS — G8929 Other chronic pain: Secondary | ICD-10-CM | POA: Diagnosis not present

## 2018-10-06 DIAGNOSIS — M792 Neuralgia and neuritis, unspecified: Secondary | ICD-10-CM | POA: Diagnosis not present

## 2018-10-20 DIAGNOSIS — G8929 Other chronic pain: Secondary | ICD-10-CM | POA: Diagnosis not present

## 2018-10-20 DIAGNOSIS — M533 Sacrococcygeal disorders, not elsewhere classified: Secondary | ICD-10-CM | POA: Diagnosis not present

## 2018-10-20 DIAGNOSIS — M6281 Muscle weakness (generalized): Secondary | ICD-10-CM | POA: Diagnosis not present

## 2018-10-20 DIAGNOSIS — M545 Low back pain: Secondary | ICD-10-CM | POA: Diagnosis not present

## 2018-10-23 ENCOUNTER — Encounter (INDEPENDENT_AMBULATORY_CARE_PROVIDER_SITE_OTHER): Payer: Self-pay

## 2018-10-30 DIAGNOSIS — M545 Low back pain: Secondary | ICD-10-CM | POA: Diagnosis not present

## 2018-10-30 DIAGNOSIS — M533 Sacrococcygeal disorders, not elsewhere classified: Secondary | ICD-10-CM | POA: Diagnosis not present

## 2018-10-30 DIAGNOSIS — G8929 Other chronic pain: Secondary | ICD-10-CM | POA: Diagnosis not present

## 2018-11-03 ENCOUNTER — Ambulatory Visit (INDEPENDENT_AMBULATORY_CARE_PROVIDER_SITE_OTHER): Payer: BLUE CROSS/BLUE SHIELD | Admitting: Family Medicine

## 2018-11-03 ENCOUNTER — Encounter (INDEPENDENT_AMBULATORY_CARE_PROVIDER_SITE_OTHER): Payer: Self-pay | Admitting: Family Medicine

## 2018-11-03 VITALS — BP 168/77 | HR 60 | Temp 98.5°F | Ht 62.0 in | Wt 202.0 lb

## 2018-11-03 DIAGNOSIS — R5383 Other fatigue: Secondary | ICD-10-CM

## 2018-11-03 DIAGNOSIS — R0602 Shortness of breath: Secondary | ICD-10-CM

## 2018-11-03 DIAGNOSIS — Z1331 Encounter for screening for depression: Secondary | ICD-10-CM | POA: Diagnosis not present

## 2018-11-03 DIAGNOSIS — E559 Vitamin D deficiency, unspecified: Secondary | ICD-10-CM | POA: Diagnosis not present

## 2018-11-03 DIAGNOSIS — Z9189 Other specified personal risk factors, not elsewhere classified: Secondary | ICD-10-CM

## 2018-11-03 DIAGNOSIS — I10 Essential (primary) hypertension: Secondary | ICD-10-CM | POA: Diagnosis not present

## 2018-11-03 DIAGNOSIS — Z6837 Body mass index (BMI) 37.0-37.9, adult: Secondary | ICD-10-CM

## 2018-11-03 DIAGNOSIS — R7303 Prediabetes: Secondary | ICD-10-CM | POA: Diagnosis not present

## 2018-11-03 DIAGNOSIS — Z0289 Encounter for other administrative examinations: Secondary | ICD-10-CM

## 2018-11-03 NOTE — Progress Notes (Signed)
Office: [phone]  /  Fax: [phone]   Dear Dr. Garwin Brothers,   Thank you for referring Tonya Clarke to our clinic. The following note includes my evaluation and treatment recommendations.  HPI:   Chief Complaint: OBESITY    Tonya Clarke has been referred by [facility] A. Garwin Brothers, MD for consultation regarding her obesity and obesity related comorbidities.    SIBEL KHURANA (MR# [id number]) is a 57 y.o. female who presents on 11/03/2018 for obesity evaluation and treatment. Current BMI is Body mass index is 36.95 kg/m.Marland Kitchen Avin has been struggling with her weight for many years and has been unsuccessful in either losing weight, maintaining weight loss, or reaching her healthy weight goal.     Yoshino has a long list of medications that she is allergic to and she believes in supplements to treat her medical problems. Deirdra is trying to be vegetarian for health reasons and she is skipping breakfast frequently. Mialani struggles to meal prep.     Lucciana attended our information session and states she is currently in the action stage of change and ready to dedicate time achieving and maintaining a healthier weight. Morissa is interested in becoming our patient and working on intensive lifestyle modifications including (but not limited to) diet, exercise and weight loss.    Maidie states her family eats meals together she thinks her family will eat healthier with  her her desired weight loss is 75 lbs she has been heavy most of  her life she started gaining weight in 1988 her heaviest weight ever was 205 lbs. she has significant food cravings issues  she snacks frequently in the evenings she skips meals frequently she is trying to eat vegetarian she is frequently drinking liquids with calories she sometimes makes poor food choices she has problems with excessive hunger  she frequently eats larger portions than normal  she has binge eating behaviors she  struggles with emotional eating    Fatigue Kerby feels her energy is lower than it should be. This has worsened with weight gain and has not worsened recently. Laurena admits to daytime somnolence and admits to waking up still tired. Patient is at risk for obstructive sleep apnea. Patent has a history of symptoms of daytime fatigue, morning fatigue and hypertension. Patient generally gets 7 hours of sleep per night. Snoring is present. Apneic episodes are not present. Epworth Sleepiness Score is 14  Dyspnea on exertion [name] notes increasing shortness of breath with exercising and seems to be worsening over time with weight gain. She notes getting out of breath sooner with activity than she used to. This has not gotten worse recently. Khristie denies orthopnea.  Hypertension MONTA MAIORANA is a 57 y.o. female with hypertension. Her blood pressure is elevated today at 168/77. Cheronda declines amlodipine or lisinopril, due to unique side effects. Tonya Clarke denies chest pain. She is working weight loss to help control her blood pressure with the goal of decreasing her risk of heart attack and stroke. Elizabeths blood pressure is not currently controlled.  At risk for cardiovascular disease Vivi is at a higher than average risk for cardiovascular disease due to obesity and hypertension. She currently denies any chest pain.  Positive Depression Screen Lafern has significant emotional eating and a PHQ-9 score of 21. Hallel notes significant stress eating, especially with anxiety.  Faryal's Food and Mood (modified PHQ-9) score was  Depression screen PHQ 2/9 11/03/2018  Decreased Interest 3  Down, Depressed, Hopeless 2  PHQ -  2 Score 5  Altered sleeping 2  Tired, decreased energy 3  Change in appetite 3  Feeling bad or failure about yourself  3  Trouble concentrating 3  Moving slowly or fidgety/restless 2  Suicidal thoughts 0  PHQ-9 Score 21  Difficult  doing work/chores Somewhat difficult    ALLERGIES: Allergies  Allergen Reactions  . Aspirin Anaphylaxis  . Penicillins Anaphylaxis  . Amlodipine Itching    Makes skin crawl  . Hydrochlorothiazide Other (See Comments)    "makes skin burn"  . Lisinopril Other (See Comments)    Makes sinuses drain   . Red Dye     "makes my skin crawl"  . Sulfa Antibiotics Other (See Comments)    "makes my skin burn"  . Valtrex [Valacyclovir] Other (See Comments)    Joints swell    MEDICATIONS: Current Outpatient Medications on File Prior to Visit  Medication Sig Dispense Refill  . cholecalciferol (VITAMIN D) 1000 units tablet Take 1,000 Units by mouth daily.    . Cyanocobalamin (VITAMIN B 12 PO) Take 1 capsule by mouth daily.    Marland Kitchen HYDROcodone-acetaminophen (NORCO/VICODIN) 5-325 MG tablet Take 1 tablet by mouth every 6 (six) hours as needed for moderate pain.    . Multiple Vitamin (MULTIVITAMIN WITH MINERALS) TABS tablet Take 1 tablet by mouth daily.    Marland Kitchen tiZANidine (ZANAFLEX) 4 MG capsule Take 4 mg by mouth 3 (three) times daily as needed for muscle spasms.     No current facility-administered medications on file prior to visit.     PAST MEDICAL HISTORY: Past Medical History:  Diagnosis Date  . Acid reflux   . Anemia   . Anxiety   . Chronic back pain   . Chronic SI joint pain   . Constipation   . Frequent urination   . Glaucoma   . HTN (hypertension)   . Infertility, female   . Leg edema   . No pertinent past medical history   . Right hip pain   . Right leg numbness     PAST SURGICAL HISTORY: Past Surgical History:  Procedure Laterality Date  . BILATERAL SALPINGECTOMY  01/08/2013   Procedure: BILATERAL SALPINGECTOMY;  Surgeon: Marvene Staff, MD;  Location: [facility];  Service: Gynecology;  Laterality: Bilateral;  . CATARACT EXTRACTION    . CESAREAN SECTION    . DIAGNOSTIC LAPAROSCOPY  1980's  . DILATION AND CURETTAGE OF UTERUS  2011  . ROBOTIC ASSISTED TOTAL HYSTERECTOMY   01/08/2013   Procedure: ROBOTIC ASSISTED TOTAL HYSTERECTOMY;  Surgeon: Marvene Staff, MD;  Location: [facility];  Service: Gynecology;  Laterality: N/A;  3 hrs.  . WISDOM TOOTH EXTRACTION      SOCIAL HISTORY: Social History   Tobacco Use  . Smoking status: Former Smoker    Packs/day: 2.50    Years: 20.00    Pack years: 50.00    Last attempt to quit: 01/01/1995    Years since quitting: 23.8  . Smokeless tobacco: Never Used  Substance Use Topics  . Alcohol use: Yes    Comment: occasional  . Drug use: No    FAMILY HISTORY: Family History  Problem Relation Age of Onset  . Breast cancer Cousin   . Obesity Mother   . Cancer Father     ROS: Review of Systems  Constitutional: Positive for malaise/fatigue.  HENT: Positive for congestion (nasal stuffiness).        + Dry Mouth  Respiratory: Positive for shortness of breath (with activity).  Cardiovascular: Negative for chest pain and orthopnea.  Gastrointestinal: Positive for constipation, heartburn and nausea.  Genitourinary: Positive for frequency.  Musculoskeletal: Positive for back pain.       + Muscle or Joint Pain  Skin: Positive for rash.       + Dryness + Hair or Nail Changes  Endo/Heme/Allergies:       + Heat or Cold Intolerance  Psychiatric/Behavioral: The patient is nervous/anxious (anxiety).     PHYSICAL EXAM: Blood pressure (!) 168/77, pulse 60, temperature 98.5 F (36.9 C), temperature source Oral, height 5\' 2"  (1.575 m), weight 202 lb (91.6 kg), last menstrual period 12/19/2012, SpO2 100 %. Body mass index is 36.95 kg/m. Physical Exam  Constitutional: She is oriented to person, place, and time. She appears well-developed and well-nourished.  HENT:  Head: Normocephalic and atraumatic.  Nose: Nose normal.  Eyes: EOM are normal. No scleral icterus.  Neck: Normal range of motion. Neck supple. No thyromegaly present.  Cardiovascular: Normal rate and regular rhythm.  Pulmonary/Chest: Effort normal. No  respiratory distress.  Abdominal: Soft. There is no tenderness.  + Obesity  Musculoskeletal: Normal range of motion.  Range of Motion normal in all 4 extremities  Neurological: She is alert and oriented to person, place, and time. Coordination normal.  Skin: Skin is warm and dry.  Psychiatric: She has a normal mood and affect.  Vitals reviewed.   RECENT LABS AND TESTS: BMET    Component Value Date/Time   NA 136 01/09/2013 0540   K 4.0 01/09/2013 0540   CL 102 01/09/2013 0540   CO2 27 01/09/2013 0540   GLUCOSE 130 (H) 01/09/2013 0540   BUN 5 (L) 01/09/2013 0540   CREATININE 0.81 01/09/2013 0540   CALCIUM 9.1 01/09/2013 0540   GFRNONAA 84 (L) 01/09/2013 0540   GFRAA >90 01/09/2013 0540   No results found for: HGBA1C No results found for: INSULIN CBC    Component Value Date/Time   WBC 12.5 (H) 01/09/2013 0540   RBC 4.11 01/09/2013 0540   HGB 12.3 01/09/2013 0540   HCT 37.7 01/09/2013 0540   PLT 175 01/09/2013 0540   MCV 91.7 01/09/2013 0540   MCH 29.9 01/09/2013 0540   MCHC 32.6 01/09/2013 0540   RDW 13.2 01/09/2013 0540   Iron/TIBC/Ferritin/ %Sat No results found for: IRON, TIBC, FERRITIN, IRONPCTSAT Lipid Panel  No results found for: CHOL, TRIG, HDL, CHOLHDL, VLDL, LDLCALC, LDLDIRECT Hepatic Function Panel  No results found for: PROT, ALBUMIN, AST, ALT, ALKPHOS, BILITOT, BILIDIR, IBILI No results found for: TSH  ECG  shows NSR with a rate of 65 BPM INDIRECT CALORIMETER done today shows a VO2 of 187 and a REE of 1300.  Her calculated basal metabolic rate is 3710 thus her basal metabolic rate is worse than expected.    ASSESSMENT AND PLAN: Other fatigue - Plan: EKG 12-Lead, Vitamin B12, CBC With Differential, Comprehensive metabolic panel, Folate, Hemoglobin A1c, Insulin, random, Lipid Panel With LDL/HDL Ratio, T3, T4, free, TSH, VITAMIN D 25 Hydroxy (Vit-D Deficiency, Fractures)  Shortness of breath on exertion - Plan: CBC With Differential  Essential  hypertension - Plan: Comprehensive metabolic panel  Positive depression screening  At risk for heart disease  Class 2 severe obesity with serious comorbidity and body mass index (BMI) of 37.0 to 37.9 in adult, unspecified obesity type (HCC)  PLAN: Fatigue Harriet was informed that her fatigue may be related to obesity, depression or many other causes. Labs will be ordered, and in the meanwhile Shade has  agreed to work on diet, exercise and weight loss to help with fatigue. Proper sleep hygiene was discussed including the need for 7-8 hours of quality sleep each night. A sleep study was not ordered based on symptoms and Epworth score.  Dyspnea on exertion Zohal's shortness of breath appears to be obesity related and exercise induced. She has agreed to work on weight loss and gradually increase exercise to treat her exercise induced shortness of breath. If Natasa follows our instructions and loses weight without improvement of her shortness of breath, we will plan to refer to pulmonology. We will monitor this condition regularly. Madelyn agrees to this plan.  Hypertension We discussed sodium restriction, working on healthy weight loss, and a regular exercise program as the means to achieve improved blood pressure control. Cleona agreed with this plan and agreed to follow up as directed. We will continue to monitor her blood pressure as well as her progress with the above lifestyle modifications. We will check labs and she will watch for signs of hypotension as she continues her lifestyle modifications.  Cardiovascular risk counseling Amand was given extended (15 minutes) coronary artery disease prevention counseling today. She is 57 y.o. female and has risk factors for heart disease including obesity and hypertension. We discussed intensive lifestyle modifications today with an emphasis on specific weight loss instructions and strategies. Pt was also informed of the importance  of increasing exercise and decreasing saturated fats to help prevent heart disease.  Positive Depression Screen Almendra had a strongly positive depression screening. Depression is commonly associated with obesity and often results in emotional eating behaviors. We will monitor this closely and work on CBT to help improve the non-hunger eating patterns. We will refer to Dr. Mallie Mussel our bariatric psychologist.  Obesity Eloise is currently in the action stage of change and her goal is to continue with weight loss efforts. I recommend Stephani begin the structured treatment plan as follows:  She has agreed to follow the Category 2 plan Jackolyn has been instructed to eventually work up to a goal of 150 minutes of combined cardio and strengthening exercise per week for weight loss and overall health benefits. We discussed the following Behavioral Modification Strategies today: no skipping meals, increasing lean protein intake, decreasing simple carbohydrates  and work on meal planning and easy cooking plans   She was informed of the importance of frequent follow up visits to maximize her success with intensive lifestyle modifications for her multiple health conditions. She was informed we would discuss her lab results at her next visit unless there is a critical issue that needs to be addressed sooner. Melia agreed to keep her next visit at the agreed upon time to discuss these results.    OBESITY BEHAVIORAL INTERVENTION VISIT  Today's visit was # 1   Starting weight: 202 lbs Starting date: 11/03/18 Today's weight : 202 lbs  Today's date: 11/03/2018 Total lbs lost to date: 0   ASK: We discussed the diagnosis of obesity with Tonya Clarke today and [name] agreed to give [place] permission to discuss obesity behavioral modification therapy today.  ASSESS: Analyse has the diagnosis of obesity and her BMI today is 36.94 Deara is in the action stage of change    ADVISE: Keiarah was educated on the multiple health risks of obesity as well as the benefit of weight loss to improve her health. She was advised of the need for long term treatment and the importance of lifestyle modifications to improve her current health and  to decrease her risk of future health problems.  AGREE: Multiple dietary modification options and treatment options were discussed and  Bricia agreed to follow the recommendations documented in the above note.  ARRANGE: Tyyonna was educated on the importance of frequent visits to treat obesity as outlined per CMS and USPSTF guidelines and agreed to schedule her next follow up appointment today.  I, Doreene Nest, am acting as transcriptionist for Dennard Nip, MD  I have reviewed the above documentation for accuracy and completeness, and I agree with the above. -Dennard Nip, MD

## 2018-11-04 ENCOUNTER — Encounter (INDEPENDENT_AMBULATORY_CARE_PROVIDER_SITE_OTHER): Payer: Self-pay | Admitting: Family Medicine

## 2018-11-04 LAB — CBC WITH DIFFERENTIAL
BASOS: 0 %
Basophils Absolute: 0 10*3/uL (ref 0.0–0.2)
EOS (ABSOLUTE): 0.1 10*3/uL (ref 0.0–0.4)
EOS: 1 %
HEMATOCRIT: 38.6 % (ref 34.0–46.6)
Hemoglobin: 12.9 g/dL (ref 11.1–15.9)
IMMATURE GRANS (ABS): 0 10*3/uL (ref 0.0–0.1)
IMMATURE GRANULOCYTES: 0 %
LYMPHS: 23 %
Lymphocytes Absolute: 1.8 10*3/uL (ref 0.7–3.1)
MCH: 29.9 pg (ref 26.6–33.0)
MCHC: 33.4 g/dL (ref 31.5–35.7)
MCV: 89 fL (ref 79–97)
MONOS ABS: 0.5 10*3/uL (ref 0.1–0.9)
Monocytes: 6 %
NEUTROS PCT: 70 %
Neutrophils Absolute: 5.3 10*3/uL (ref 1.4–7.0)
RBC: 4.32 x10E6/uL (ref 3.77–5.28)
RDW: 13.2 % (ref 12.3–15.4)
WBC: 7.8 10*3/uL (ref 3.4–10.8)

## 2018-11-04 LAB — COMPREHENSIVE METABOLIC PANEL
ALT: 21 IU/L (ref 0–32)
AST: 23 IU/L (ref 0–40)
Albumin/Globulin Ratio: 1.7 (ref 1.2–2.2)
Albumin: 4.3 g/dL (ref 3.5–5.5)
Alkaline Phosphatase: 87 IU/L (ref 39–117)
BUN/Creatinine Ratio: 14 (ref 9–23)
BUN: 10 mg/dL (ref 6–24)
Bilirubin Total: 0.3 mg/dL (ref 0.0–1.2)
CO2: 24 mmol/L (ref 20–29)
CREATININE: 0.72 mg/dL (ref 0.57–1.00)
Calcium: 9.6 mg/dL (ref 8.7–10.2)
Chloride: 102 mmol/L (ref 96–106)
GFR calc non Af Amer: 95 mL/min/{1.73_m2} (ref >59)
GFR, EST AFRICAN AMERICAN: 109 mL/min/{1.73_m2} (ref >59)
Globulin, Total: 2.6 g/dL (ref 1.5–4.5)
Glucose: 98 mg/dL (ref 65–99)
Potassium: 4 mmol/L (ref 3.5–5.2)
Sodium: 142 mmol/L (ref 134–144)
TOTAL PROTEIN: 6.9 g/dL (ref 6.0–8.5)

## 2018-11-04 LAB — HEMOGLOBIN A1C
ESTIMATED AVERAGE GLUCOSE: 131 mg/dL
HEMOGLOBIN A1C: 6.2 % — AB (ref 4.8–5.6)

## 2018-11-04 LAB — T3: T3, Total: 119 ng/dL (ref 71–180)

## 2018-11-04 LAB — LIPID PANEL WITH LDL/HDL RATIO
CHOLESTEROL TOTAL: 148 mg/dL (ref 100–199)
HDL: 72 mg/dL (ref >39)
LDL Calculated: 61 mg/dL (ref 0–99)
LDl/HDL Ratio: 0.8 ratio (ref 0.0–3.2)
TRIGLYCERIDES: 75 mg/dL (ref 0–149)
VLDL Cholesterol Cal: 15 mg/dL (ref 5–40)

## 2018-11-04 LAB — VITAMIN B12: VITAMIN B 12: 889 pg/mL (ref 232–1245)

## 2018-11-04 LAB — VITAMIN D 25 HYDROXY (VIT D DEFICIENCY, FRACTURES): VIT D 25 HYDROXY: 21.9 ng/mL — AB (ref 30.0–100.0)

## 2018-11-04 LAB — T4, FREE: Free T4: 1.19 ng/dL (ref 0.82–1.77)

## 2018-11-04 LAB — TSH: TSH: 1.18 u[IU]/mL (ref 0.450–4.500)

## 2018-11-04 LAB — INSULIN, RANDOM: INSULIN: 20.5 u[IU]/mL (ref 2.6–24.9)

## 2018-11-04 LAB — FOLATE: Folate: 18 ng/mL (ref >3.0)

## 2018-11-06 DIAGNOSIS — M533 Sacrococcygeal disorders, not elsewhere classified: Secondary | ICD-10-CM | POA: Diagnosis not present

## 2018-11-06 DIAGNOSIS — M545 Low back pain: Secondary | ICD-10-CM | POA: Diagnosis not present

## 2018-11-06 DIAGNOSIS — G8929 Other chronic pain: Secondary | ICD-10-CM | POA: Diagnosis not present

## 2018-11-06 DIAGNOSIS — M6281 Muscle weakness (generalized): Secondary | ICD-10-CM | POA: Diagnosis not present

## 2018-11-14 DIAGNOSIS — H04123 Dry eye syndrome of bilateral lacrimal glands: Secondary | ICD-10-CM | POA: Diagnosis not present

## 2018-11-14 DIAGNOSIS — H401231 Low-tension glaucoma, bilateral, mild stage: Secondary | ICD-10-CM | POA: Diagnosis not present

## 2018-11-14 DIAGNOSIS — H33302 Unspecified retinal break, left eye: Secondary | ICD-10-CM | POA: Diagnosis not present

## 2018-11-14 DIAGNOSIS — H35412 Lattice degeneration of retina, left eye: Secondary | ICD-10-CM | POA: Diagnosis not present

## 2018-11-19 DIAGNOSIS — J069 Acute upper respiratory infection, unspecified: Secondary | ICD-10-CM | POA: Diagnosis not present

## 2018-11-19 DIAGNOSIS — Z87891 Personal history of nicotine dependence: Secondary | ICD-10-CM | POA: Diagnosis not present

## 2018-11-19 DIAGNOSIS — I10 Essential (primary) hypertension: Secondary | ICD-10-CM | POA: Diagnosis not present

## 2018-11-20 ENCOUNTER — Ambulatory Visit (INDEPENDENT_AMBULATORY_CARE_PROVIDER_SITE_OTHER): Payer: BLUE CROSS/BLUE SHIELD | Admitting: Family Medicine

## 2018-11-20 VITALS — BP 137/76 | HR 68 | Temp 98.1°F | Ht 62.0 in | Wt 204.0 lb

## 2018-11-20 DIAGNOSIS — E559 Vitamin D deficiency, unspecified: Secondary | ICD-10-CM

## 2018-11-20 DIAGNOSIS — Z9189 Other specified personal risk factors, not elsewhere classified: Secondary | ICD-10-CM | POA: Diagnosis not present

## 2018-11-20 DIAGNOSIS — R7303 Prediabetes: Secondary | ICD-10-CM

## 2018-11-20 DIAGNOSIS — Z6837 Body mass index (BMI) 37.0-37.9, adult: Secondary | ICD-10-CM

## 2018-11-20 DIAGNOSIS — E66812 Obesity, class 2: Secondary | ICD-10-CM

## 2018-11-20 MED ORDER — VITAMIN D (ERGOCALCIFEROL) 1.25 MG (50000 UNIT) PO CAPS: 50000.0000 [IU] | ORAL_CAPSULE | ORAL | 0 refills | Status: DC

## 2018-11-24 ENCOUNTER — Ambulatory Visit (INDEPENDENT_AMBULATORY_CARE_PROVIDER_SITE_OTHER): Payer: BLUE CROSS/BLUE SHIELD | Admitting: Psychology

## 2018-11-24 DIAGNOSIS — F3289 Other specified depressive episodes: Secondary | ICD-10-CM | POA: Diagnosis not present
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
## 2018-11-25 NOTE — Progress Notes (Signed)
Office: [phone]  /  Fax: [phone]   HPI:   Chief Complaint: OBESITY Tonya Clarke is here to discuss her progress with her obesity treatment plan. She is on the Category 2 plan and is following her eating plan approximately 60 % of the time. She states she is walking for 15-30 minutes 7 times per week. Kaleisha struggled to follow her plan closely. She didn't like to eat protein and didn't do this well.  Her weight is 204 lb (92.5 kg) today and has gained 2 pounds since her last visit. She has lost 0 lbs since starting treatment with [place].  Vitamin D Deficiency Tonya Clarke has a new diagnosis of vitamin D deficiency. She is not currently taking Vit D. She notes fatigue and denies nausea, vomiting or muscle weakness.  Pre-Diabetes Tonya Clarke has a diagnosis of pre-diabetes based on her elevated Hgb A1c at 6.2 and was informed this puts her at greater risk of developing diabetes. She is not taking metformin, and she would like to try to diet control. She normally eats increased carbohydrates, but in the form of "whole foods". She denies nausea or hypoglycemia.  At risk for diabetes Tonya Clarke is at higher than average risk for developing diabetes due to her obesity and pre-diabetes. She currently denies polyuria or polydipsia.  ALLERGIES: Allergies  Allergen Reactions  . Aspirin Anaphylaxis  . Penicillins Anaphylaxis  . Amlodipine Itching    Makes skin crawl  . Hydrochlorothiazide Other (See Comments)    "makes skin burn"  . Lisinopril Other (See Comments)    Makes sinuses drain   . Red Dye     "makes my skin crawl"  . Sulfa Antibiotics Other (See Comments)    "makes my skin burn"  . Valtrex [Valacyclovir] Other (See Comments)    Joints swell    MEDICATIONS: Current Outpatient Medications on File Prior to Visit  Medication Sig Dispense Refill  . cholecalciferol (VITAMIN D) 1000 units tablet Take 1,000 Units by mouth daily.    . Cyanocobalamin (VITAMIN B 12 PO) Take 1  capsule by mouth daily.    Marland Kitchen HYDROcodone-acetaminophen (NORCO/VICODIN) 5-325 MG tablet Take 1 tablet by mouth every 6 (six) hours as needed for moderate pain.    Marland Kitchen losartan (COZAAR) 50 MG tablet Take 50 mg by mouth daily.    . Multiple Vitamin (MULTIVITAMIN WITH MINERALS) TABS tablet Take 1 tablet by mouth daily.    Marland Kitchen tiZANidine (ZANAFLEX) 4 MG capsule Take 4 mg by mouth 3 (three) times daily as needed for muscle spasms.     No current facility-administered medications on file prior to visit.     PAST MEDICAL HISTORY: Past Medical History:  Diagnosis Date  . Acid reflux   . Anemia   . Anxiety   . Chronic back pain   . Chronic SI joint pain   . Constipation   . Frequent urination   . Glaucoma   . HTN (hypertension)   . Infertility, female   . Leg edema   . No pertinent past medical history   . Right hip pain   . Right leg numbness     PAST SURGICAL HISTORY: Past Surgical History:  Procedure Laterality Date  . BILATERAL SALPINGECTOMY  01/08/2013   Procedure: BILATERAL SALPINGECTOMY;  Surgeon: Marvene Staff, MD;  Location: [facility];  Service: Gynecology;  Laterality: Bilateral;  . CATARACT EXTRACTION    . CESAREAN SECTION    . DIAGNOSTIC LAPAROSCOPY  1980's  . DILATION AND CURETTAGE OF UTERUS  2011  .  ROBOTIC ASSISTED TOTAL HYSTERECTOMY  01/08/2013   Procedure: ROBOTIC ASSISTED TOTAL HYSTERECTOMY;  Surgeon: Marvene Staff, MD;  Location: [facility];  Service: Gynecology;  Laterality: N/A;  3 hrs.  . WISDOM TOOTH EXTRACTION      SOCIAL HISTORY: Social History   Tobacco Use  . Smoking status: Former Smoker    Packs/day: 2.50    Years: 20.00    Pack years: 50.00    Last attempt to quit: 01/01/1995    Years since quitting: 23.9  . Smokeless tobacco: Never Used  Substance Use Topics  . Alcohol use: Yes    Comment: occasional  . Drug use: No    FAMILY HISTORY: Family History  Problem Relation Age of Onset  . Breast cancer Cousin   . Obesity Mother   . Cancer  Father     ROS: Review of Systems  Constitutional: Positive for malaise/fatigue. Negative for weight loss.  Gastrointestinal: Negative for nausea and vomiting.  Genitourinary: Negative for frequency.  Musculoskeletal:       Negative muscle weakness  Endo/Heme/Allergies: Negative for polydipsia.       Negative hypoglycemia    PHYSICAL EXAM: Blood pressure 137/76, pulse 68, temperature 98.1 F (36.7 C), temperature source Oral, height 5\' 2"  (1.575 m), weight 204 lb (92.5 kg), last menstrual period 12/19/2012, SpO2 100 %. Body mass index is 37.31 kg/m. Physical Exam  Constitutional: She is oriented to person, place, and time. She appears well-developed and well-nourished.  Cardiovascular: Normal rate.  Pulmonary/Chest: Effort normal.  Musculoskeletal: Normal range of motion.  Neurological: She is oriented to person, place, and time.  Skin: Skin is warm and dry.  Psychiatric: She has a normal mood and affect. Her behavior is normal.  Vitals reviewed.   RECENT LABS AND TESTS: BMET    Component Value Date/Time   NA 142 11/03/2018 1025   K 4.0 11/03/2018 1025   CL 102 11/03/2018 1025   CO2 24 11/03/2018 1025   GLUCOSE 98 11/03/2018 1025   GLUCOSE 130 (H) 01/09/2013 0540   BUN 10 11/03/2018 1025   CREATININE 0.72 11/03/2018 1025   CALCIUM 9.6 11/03/2018 1025   GFRNONAA 95 11/03/2018 1025   GFRAA 109 11/03/2018 1025   Lab Results  Component Value Date   HGBA1C 6.2 (H) 11/03/2018   Lab Results  Component Value Date   INSULIN 20.5 11/03/2018   CBC    Component Value Date/Time   WBC 7.8 11/03/2018 1025   WBC 12.5 (H) 01/09/2013 0540   RBC 4.32 11/03/2018 1025   RBC 4.11 01/09/2013 0540   HGB 12.9 11/03/2018 1025   HCT 38.6 11/03/2018 1025   PLT 175 01/09/2013 0540   MCV 89 11/03/2018 1025   MCH 29.9 11/03/2018 1025   MCH 29.9 01/09/2013 0540   MCHC 33.4 11/03/2018 1025   MCHC 32.6 01/09/2013 0540   RDW 13.2 11/03/2018 1025   LYMPHSABS 1.8 11/03/2018 1025    EOSABS 0.1 11/03/2018 1025   BASOSABS 0.0 11/03/2018 1025   Iron/TIBC/Ferritin/ %Sat No results found for: IRON, TIBC, FERRITIN, IRONPCTSAT Lipid Panel     Component Value Date/Time   CHOL 148 11/03/2018 1025   TRIG 75 11/03/2018 1025   HDL 72 11/03/2018 1025   LDLCALC 61 11/03/2018 1025   Hepatic Function Panel     Component Value Date/Time   PROT 6.9 11/03/2018 1025   ALBUMIN 4.3 11/03/2018 1025   AST 23 11/03/2018 1025   ALT 21 11/03/2018 1025   ALKPHOS 87 11/03/2018 1025  BILITOT 0.3 11/03/2018 1025      Component Value Date/Time   TSH 1.180 11/03/2018 1025  Results for OMAYRA, TULLOCH (MRN [id number]) as of 11/25/2018 12:38  Ref. Range 11/03/2018 10:25  Vitamin D, 25-Hydroxy Latest Ref Range: 30.0 - 100.0 ng/mL 21.9 (L)    ASSESSMENT AND PLAN: Vitamin D deficiency - Plan: Vitamin D, Ergocalciferol, (DRISDOL) 1.25 MG (50000 UT) CAPS capsule  Pre-diabetes  At risk for diabetes mellitus  Class 2 severe obesity with serious comorbidity and body mass index (BMI) of 37.0 to 37.9 in adult, unspecified obesity type ([place])  PLAN:  Vitamin D Deficiency Tonya Clarke was informed that low vitamin D levels contributes to fatigue and are associated with obesity, breast, and colon cancer. Tonya Clarke agrees to start prescription Vit D @50 ,000 IU every week #4 with no refills. She will follow up for routine testing of vitamin D, at least 2-3 times per year. She was informed of the risk of over-replacement of vitamin D and agrees to not increase her dose unless she discusses this with [place] first. Tonya Clarke agrees to follow up with our clinic in 2 weeks with Tonya Clarke, Tonya Clarke.  Pre-Diabetes Tonya Clarke will continue to work on weight loss, exercise, and decreasing simple carbohydrates in her diet to help decrease the risk of diabetes. We dicussed metformin including benefits and risks. She was informed that eating too many simple carbohydrates or too many calories at one sitting increases  the likelihood of GI side effects. Donzella declined metformin for now and a prescription was not written today. Tonya Clarke agrees to follow up with our clinic in 2 weeks with Tonya Bathe, FNP as directed to monitor her progress.  Diabetes risk counselling Caralyn was given extended (15 minutes) diabetes prevention counseling today. She is 57 y.o. female and has risk factors for diabetes including obesity and pre-diabetes. We discussed intensive lifestyle modifications today with an emphasis on weight loss as well as increasing exercise and decreasing simple carbohydrates in her diet.  Obesity Tonya Clarke is currently in the action stage of change. As such, her goal is to continue with weight loss efforts She has agreed to follow the Category 2 plan Tonya Clarke has been instructed to work up to a goal of 150 minutes of combined cardio and strengthening exercise per week for weight loss and overall health benefits. We discussed the following Behavioral Modification Strategies today: increasing lean protein intake, decreasing simple carbohydrates , work on meal planning and easy cooking plans and holiday eating strategies    Tonya Clarke has agreed to follow up with our clinic in 2 weeks with Tonya Clarke, Tonya Clarke. She was informed of the importance of frequent follow up visits to maximize her success with intensive lifestyle modifications for her multiple health conditions.   OBESITY BEHAVIORAL INTERVENTION VISIT  Today's visit was # 2   Starting weight: 202 lbs Starting date: 11/03/18 Today's weight : 204 lbs Today's date: 11/20/2018 Total lbs lost to date: 0    ASK: We discussed the diagnosis of obesity with Tonya Clarke today and Tonya Clarke agreed to give [place] permission to discuss obesity behavioral modification therapy today.  ASSESS: Jaylani has the diagnosis of obesity and her BMI today is 110.3 Zareen is in the action stage of change   ADVISE: Cohen was educated on the  multiple health risks of obesity as well as the benefit of weight loss to improve her health. She was advised of the need for long term treatment and the importance of lifestyle modifications to improve her  current health and to decrease her risk of future health problems.  AGREE: Multiple dietary modification options and treatment options were discussed and  Keta agreed to follow the recommendations documented in the above note.  ARRANGE: Astella was educated on the importance of frequent visits to treat obesity as outlined per CMS and USPSTF guidelines and agreed to schedule her next follow up appointment today.  I, Tonya Clarke, am acting as transcriptionist for Tonya Nip, MD  I have reviewed the above documentation for accuracy and completeness, and I agree with the above. -Tonya Nip, MD

## 2018-12-03 DIAGNOSIS — J069 Acute upper respiratory infection, unspecified: Secondary | ICD-10-CM | POA: Diagnosis not present

## 2018-12-03 DIAGNOSIS — R05 Cough: Secondary | ICD-10-CM | POA: Diagnosis not present

## 2018-12-04 ENCOUNTER — Encounter (INDEPENDENT_AMBULATORY_CARE_PROVIDER_SITE_OTHER): Payer: Self-pay | Admitting: Family Medicine

## 2018-12-11 ENCOUNTER — Encounter (INDEPENDENT_AMBULATORY_CARE_PROVIDER_SITE_OTHER): Payer: Self-pay | Admitting: Family Medicine

## 2018-12-11 ENCOUNTER — Ambulatory Visit (INDEPENDENT_AMBULATORY_CARE_PROVIDER_SITE_OTHER): Payer: BLUE CROSS/BLUE SHIELD | Admitting: Family Medicine

## 2018-12-11 VITALS — BP 150/79 | HR 76 | Temp 98.4°F | Ht 62.0 in | Wt 206.0 lb

## 2018-12-11 DIAGNOSIS — R7303 Prediabetes: Secondary | ICD-10-CM | POA: Diagnosis not present

## 2018-12-11 DIAGNOSIS — E559 Vitamin D deficiency, unspecified: Secondary | ICD-10-CM

## 2018-12-11 DIAGNOSIS — Z9189 Other specified personal risk factors, not elsewhere classified: Secondary | ICD-10-CM

## 2018-12-11 DIAGNOSIS — Z6837 Body mass index (BMI) 37.0-37.9, adult: Secondary | ICD-10-CM

## 2018-12-11 MED ORDER — VITAMIN D (ERGOCALCIFEROL) 1.25 MG (50000 UNIT) PO CAPS: 50000.0000 [IU] | ORAL_CAPSULE | ORAL | 0 refills | Status: DC

## 2018-12-15 NOTE — Progress Notes (Signed)
Office: [phone]  /  Fax: [phone]   HPI:   Chief Complaint: OBESITY Tonya Clarke is here to discuss her progress with her obesity treatment plan. She is on the Category 2 plan and is following her eating plan approximately 95 % of the time. She states she is walking 30 minutes 7 times per week. Tonya Clarke states that she is following her plan closely, but she wants to snack, especially in the afternoon. She is making substitutions and her calories are starting to increase while her lean protein decreasing.  Her weight is 206 lb (93.4 kg) today and has had a weight gain of 2 pounds over a period of 3 weeks since her last visit. She has lost 0 lbs since starting treatment with [place].  Vitamin D deficiency Tonya Clarke has a diagnosis of vitamin D deficiency. She is currently stable on vit D, but is not yet at goal. She denies nausea, vomiting, or muscle weakness.  Pre-Diabetes Tonya Clarke has a diagnosis of pre-diabetes based on her elevated Hgb A1c and was informed this puts her at greater risk of developing diabetes. She is not taking metformin currently and continues to work on diet and exercise to decrease risk of diabetes. She notes increased polyphagia, especially in the afternoon with increased simple carbs and reduced protein. She has deferred metformin on the past.  At risk for diabetes Tonya Clarke is at higher than average risk for developing diabetes due to her pre-diabetes and obesity. She currently denies polyuria or polydipsia.  ALLERGIES: Allergies  Allergen Reactions  . Aspirin Anaphylaxis  . Penicillins Anaphylaxis  . Amlodipine Itching    Makes skin crawl  . Hydrochlorothiazide Other (See Comments)    "makes skin burn"  . Lisinopril Other (See Comments)    Makes sinuses drain   . Red Dye     "makes my skin crawl"  . Sulfa Antibiotics Other (See Comments)    "makes my skin burn"  . Valtrex [Valacyclovir] Other (See Comments)    Joints swell  . Losartan Rash     MEDICATIONS: Current Outpatient Medications on File Prior to Visit  Medication Sig Dispense Refill  . cholecalciferol (VITAMIN D) 1000 units tablet Take 1,000 Units by mouth daily.    . Cyanocobalamin (VITAMIN B 12 PO) Take 1 capsule by mouth daily.    Tonya Clarke Kitchen HYDROcodone-acetaminophen (NORCO/VICODIN) 5-325 MG tablet Take 1 tablet by mouth every 6 (six) hours as needed for moderate pain.    . Multiple Vitamin (MULTIVITAMIN WITH MINERALS) TABS tablet Take 1 tablet by mouth daily.    Tonya Clarke Kitchen tiZANidine (ZANAFLEX) 4 MG capsule Take 4 mg by mouth 3 (three) times daily as needed for muscle spasms.     No current facility-administered medications on file prior to visit.     PAST MEDICAL HISTORY: Past Medical History:  Diagnosis Date  . Acid reflux   . Anemia   . Anxiety   . Chronic back pain   . Chronic SI joint pain   . Constipation   . Frequent urination   . Glaucoma   . HTN (hypertension)   . Infertility, female   . Leg edema   . No pertinent past medical history   . Right hip pain   . Right leg numbness     PAST SURGICAL HISTORY: Past Surgical History:  Procedure Laterality Date  . BILATERAL SALPINGECTOMY  01/08/2013   Procedure: BILATERAL SALPINGECTOMY;  Surgeon: Marvene Staff, MD;  Location: [facility];  Service: Gynecology;  Laterality: Bilateral;  . CATARACT  EXTRACTION    . CESAREAN SECTION    . DIAGNOSTIC LAPAROSCOPY  1980's  . DILATION AND CURETTAGE OF UTERUS  2011  . ROBOTIC ASSISTED TOTAL HYSTERECTOMY  01/08/2013   Procedure: ROBOTIC ASSISTED TOTAL HYSTERECTOMY;  Surgeon: Marvene Staff, MD;  Location: [facility];  Service: Gynecology;  Laterality: N/A;  3 hrs.  . WISDOM TOOTH EXTRACTION      SOCIAL HISTORY: Social History   Tobacco Use  . Smoking status: Former Smoker    Packs/day: 2.50    Years: 20.00    Pack years: 50.00    Last attempt to quit: 01/01/1995    Years since quitting: 23.9  . Smokeless tobacco: Never Used  Substance Use Topics  . Alcohol use:  Yes    Comment: occasional  . Drug use: No    FAMILY HISTORY: Family History  Problem Relation Age of Onset  . Breast cancer Cousin   . Obesity Mother   . Cancer Father     ROS: Review of Systems  Gastrointestinal: Negative for nausea and vomiting.  Genitourinary:       Negative for polyuria.  Musculoskeletal:       Negative for muscle weakness.  Endo/Heme/Allergies: Negative for polydipsia.       Positive for polyphagia.    PHYSICAL EXAM: Blood pressure (!) 150/79, pulse 76, temperature 98.4 F (36.9 C), temperature source Oral, height 5\' 2"  (1.575 m), weight 206 lb (93.4 kg), last menstrual period 12/19/2012, SpO2 100 %. Body mass index is 37.68 kg/m. Physical Exam Vitals signs reviewed.  Constitutional:      Appearance: Normal appearance. She is obese.  Cardiovascular:     Rate and Rhythm: Normal rate.  Pulmonary:     Effort: Pulmonary effort is normal.  Musculoskeletal: Normal range of motion.  Skin:    General: Skin is warm and dry.  Neurological:     Mental Status: She is alert and oriented to person, place, and time.  Psychiatric:        Mood and Affect: Mood normal.        Behavior: Behavior normal.     RECENT LABS AND TESTS: BMET    Component Value Date/Time   NA 142 11/03/2018 1025   K 4.0 11/03/2018 1025   CL 102 11/03/2018 1025   CO2 24 11/03/2018 1025   GLUCOSE 98 11/03/2018 1025   GLUCOSE 130 (H) 01/09/2013 0540   BUN 10 11/03/2018 1025   CREATININE 0.72 11/03/2018 1025   CALCIUM 9.6 11/03/2018 1025   GFRNONAA 95 11/03/2018 1025   GFRAA 109 11/03/2018 1025   Lab Results  Component Value Date   HGBA1C 6.2 (H) 11/03/2018   Lab Results  Component Value Date   INSULIN 20.5 11/03/2018   CBC    Component Value Date/Time   WBC 7.8 11/03/2018 1025   WBC 12.5 (H) 01/09/2013 0540   RBC 4.32 11/03/2018 1025   RBC 4.11 01/09/2013 0540   HGB 12.9 11/03/2018 1025   HCT 38.6 11/03/2018 1025   PLT 175 01/09/2013 0540   MCV 89 11/03/2018  1025   MCH 29.9 11/03/2018 1025   MCH 29.9 01/09/2013 0540   MCHC 33.4 11/03/2018 1025   MCHC 32.6 01/09/2013 0540   RDW 13.2 11/03/2018 1025   LYMPHSABS 1.8 11/03/2018 1025   EOSABS 0.1 11/03/2018 1025   BASOSABS 0.0 11/03/2018 1025   Iron/TIBC/Ferritin/ %Sat No results found for: IRON, TIBC, FERRITIN, IRONPCTSAT Lipid Panel     Component Value Date/Time   CHOL  148 11/03/2018 1025   TRIG 75 11/03/2018 1025   HDL 72 11/03/2018 1025   LDLCALC 61 11/03/2018 1025   Hepatic Function Panel     Component Value Date/Time   PROT 6.9 11/03/2018 1025   ALBUMIN 4.3 11/03/2018 1025   AST 23 11/03/2018 1025   ALT 21 11/03/2018 1025   ALKPHOS 87 11/03/2018 1025   BILITOT 0.3 11/03/2018 1025      Component Value Date/Time   TSH 1.180 11/03/2018 1025   Results for KANESHIA, CATER (MRN [id number]) as of 12/15/2018 11:52  Ref. Range 11/03/2018 10:25  Vitamin D, 25-Hydroxy Latest Ref Range: 30.0 - 100.0 ng/mL 21.9 (L)   ASSESSMENT AND PLAN: Vitamin D deficiency - Plan: Vitamin D, Ergocalciferol, (DRISDOL) 1.25 MG (50000 UT) CAPS capsule  Prediabetes  At risk for diabetes mellitus  Class 2 severe obesity with serious comorbidity and body mass index (BMI) of 37.0 to 37.9 in adult, unspecified obesity type ([place])  PLAN:  Vitamin D Deficiency Tonya Clarke was informed that low vitamin D levels contributes to fatigue and are associated with obesity, breast, and colon cancer. She agrees to continue to take prescription Vit D @50 ,000 IU every week #4 with no refills and will follow up for routine testing of vitamin D, at least 2-3 times per year. She was informed of the risk of over-replacement of vitamin D and agrees to not increase her dose unless she discusses this with [place] first. Tonya Clarke agrees to follow up in 3 to 4 weeks.  Pre-Diabetes Tonya Clarke will continue to work on weight loss, exercise, and decreasing simple carbohydrates in her diet to help decrease the risk of diabetes. We  discussed metformin including benefits and risks. She was informed that eating too many simple carbohydrates or too many calories at one sitting increases the likelihood of GI side effects. Tonya Clarke deferred metformin for now and we changed her eating plan. We may need to consider medications at the next visit. Tonya Clarke agreed to follow up with [place] as directed to monitor her progress.  Diabetes risk counseling Tonya Clarke was given extended (15 minutes) diabetes prevention counseling today. She is 57 y.o. female and has risk factors for diabetes including pre-diabetes and obesity. We discussed intensive lifestyle modifications today with an emphasis on weight loss as well as increasing exercise and decreasing simple carbohydrates in her diet.  Obesity Tonya Clarke is currently in the action stage of change. As such, her goal is to continue with weight loss efforts. She has agreed to keep a food journal with 1000 to 1200 calories and 70 grams of protein daily. Tonya Clarke has been instructed to work up to a goal of 150 minutes of combined cardio and strengthening exercise per week for weight loss and overall health benefits. We discussed the following Behavioral Modification Strategies today: increasing lean protein intake, decreasing simple carbohydrate, work on meal planning and easy cooking plans, and holiday eating strategies.   Tonya Clarke has agreed to follow up with our clinic in 3 to 4 weeks. She was informed of the importance of frequent follow up visits to maximize her success with intensive lifestyle modifications for her multiple health conditions.   OBESITY BEHAVIORAL INTERVENTION VISIT  Today's visit was # 3   Starting weight: 202 lbs Starting date: 11/03/18 Today's weight : Weight: 206 lb (93.4 kg)  Today's date: 12/11/2018 Total lbs lost to date: 0  ASK: We discussed the diagnosis of obesity with Tonya Clarke today and Tonya Clarke agreed to give [place] permission to discuss obesity  behavioral modification therapy today.  ASSESS: Tonya Clarke has the diagnosis of obesity and her BMI today is 37.6. Tonya Clarke is in the action stage of change.   ADVISE: Tonya Clarke was educated on the multiple health risks of obesity as well as the benefit of weight loss to improve her health. She was advised of the need for long term treatment and the importance of lifestyle modifications to improve her current health and to decrease her risk of future health problems.  AGREE: Multiple dietary modification options and treatment options were discussed and Tonya Clarke agreed to follow the recommendations documented in the above note.  ARRANGE: Tonya Clarke was educated on the importance of frequent visits to treat obesity as outlined per CMS and USPSTF guidelines and agreed to schedule her next follow up appointment today.  I, Marcille Blanco, am acting as transcriptionist for Starlyn Skeans, MD  I have reviewed the above documentation for accuracy and completeness, and I agree with the above. -Dennard Nip, MD

## 2018-12-18 ENCOUNTER — Ambulatory Visit (INDEPENDENT_AMBULATORY_CARE_PROVIDER_SITE_OTHER): Payer: BLUE CROSS/BLUE SHIELD | Admitting: Psychology

## 2018-12-18 DIAGNOSIS — I10 Essential (primary) hypertension: Secondary | ICD-10-CM | POA: Diagnosis not present

## 2018-12-18 DIAGNOSIS — F3289 Other specified depressive episodes: Secondary | ICD-10-CM | POA: Diagnosis not present

## 2018-12-18 DIAGNOSIS — J0140 Acute pansinusitis, unspecified: Secondary | ICD-10-CM | POA: Diagnosis not present

## 2018-12-18 NOTE — Progress Notes (Addendum)
Office: [phone]  /  Fax: [phone]   Date: December 18, 2018  Time Seen: 8:00am Duration: 25 minutes Provider: Glennie Isle, Psy.D. Type of Session: Individual Therapy   HPI: Freyja was referred by Dr. Dennard Nip due to a positive depression screen. Per the note for the initial visit with Dr. Dennard Nip on November 03, 2018, "Hadlee has significant emotional eating and a PHQ-9 score of 21. Chantel notes significant stress eating, especially with anxiety." In addition, per the note for the initial visit with Dr. Dennard Nip on November 03, 2018, Daneshia has been heavy most of her life, and shestarted gaining weight in 1988. Herheaviest weight ever was 205pounds. During the initial appointment with Dr. Leafy Ro, [name] reported experiencing the following: significant food cravings issues; snacking frequently in the evenings; skipping meals frequently; frequently drinking liquids with calories; sometimesmaking poor food choices; problems with excessive hunger;frequently eating larger portions than normal; binge eating behaviors; andstruggling with emotional eating.   During the initial appointment with this provider, [name] shared she looks for food when she is anxious. She also described herself as a "grazer." She noted the last episode of emotional eating was approximately two weeks ago due to her her "high stress job." She shared she ate something from the vending machine at work. Deauna indicated she tends to crave chocolate and salty foods. Moreover, Alishba reported she began engaging in emotional eating in 1992 while she was going through a divorce. She denied a history of binge eating and overeating. Earnie also denied a history of purging and engagement in other compensatory strategies. She has never been diagnosed with an eating disorder. Furthermore, Justyn was asked to complete a questionnaire assessing various behaviors related to emotional  eating. Yanai endorsed the following: overeat when you are celebrating, eat certain foods when you are anxious, stressed, depressed, or your feelings are hurt, use food to help you cope with emotional situations, find food is comforting to you and overeat frequently when you are bored or lonely.  Session Content: Session focused on the following treatment goal: decrease emotional eating. The session was initiated with the administration of the PHQ-9 and GAD-7, as well as a brief check-in. Rexanna discussed stressors related to work changes and the holidays. Regarding hunger, she shared she "noticed" when stress typically impacts her at work as it relates to emotional hunger. She explained it is typically secondary to "irate callers." Since recognizing the aforementioned, Shunte discussed walking around the office frequently while avoiding the vending machine. Overall, Lauryn noted a reduction in emotional eating. Psychoeducation regarding triggers for emotional eating was provided. Christella was provided a handout, and encouraged to utilize the handout between now and the next appointment to increase awareness of triggers and frequency. Mauriah agreed. Jacelynn was receptive to today's session as evidenced by openness to sharing, responsiveness to feedback, and willingness to focus further on triggers.     Mental Status Examination: Lesa arrived on time for the appointment. She presented as appropriately dressed and groomed. Brigette appeared her stated age and demonstrated adequate orientation to time, place, person, and purpose of the appointment. She also demonstrated appropriate eye contact. No psychomotor abnormalities or behavioral peculiarities noted. Her mood was euthymic with congruent affect. Her thought processes were logical, linear, and goal-directed. No hallucinations, delusions, bizarre thinking or behavior reported or observed. Judgment, insight, and impulse control appeared  to be grossly intact. There was no evidence of paraphasias (i.e., errors in speech, gross mispronunciations, and word substitutions), repetition deficits, or disturbances in  volume or prosody (i.e., rhythm and intonation). There was no evidence of attention or memory impairments. Tametra denied current suicidal and homicidal ideation, intent or plan.  Structured Assessment Results: The Patient Health Questionnaire-9 (PHQ-9) is a self-report measure that assesses symptoms and severity of depression over the course of the last two weeks. Dabria obtained a score of 2 suggesting minimal depression. Brayli finds the endorsed symptoms to be not difficult at all. Depression screen [facility] 2/9 12/18/2018  Decreased Interest 0  Down, Depressed, Hopeless 0  PHQ - 2 Score 0  Altered sleeping 0  Tired, decreased energy 1  Change in appetite 0  Feeling bad or failure about yourself  0  Trouble concentrating 1  Moving slowly or fidgety/restless 0  Suicidal thoughts 0  PHQ-9 Score 2  Difficult doing work/chores -   The Generalized Anxiety Disorder-7 (GAD-7) is a brief self-report measure that assesses symptoms of anxiety over the course of the last two weeks. Camellia obtained a score of 1 suggesting minimal anxiety. GAD 7 : Generalized Anxiety Score 12/18/2018  Nervous, Anxious, on Edge 1  Control/stop worrying 0  Worry too much - different things 0  Trouble relaxing 0  Restless 0  Easily annoyed or irritable 0  Afraid - awful might happen 0  Total GAD 7 Score 1  Anxiety Difficulty Not difficult at all   Interventions: Jacob was administered the PHQ-9 and GAD-7 for symptom monitoring. Content from the last session was reviewed. Throughout today's session, empathic reflections and validation were provided. Psychoeducation regarding triggers for emotional eating was provided.   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 Briyonna has demonstrated progress in her goal as evidenced by increased awareness of hunger patterns, and willingness to identify triggers for emotional eating. During today's appointment, Trinity noted a reduction in emotional eating.   Plan: Roneshia continues to appear able and willing to participate as evidenced by engagement in reciprocal conversation, and asking questions for clarification as appropriate. Due to the upcoming holidays and this provider being out of the office in the coming weeks, the next appointment will be scheduled in two to three weeks. The next session will focus on reviewing triggers, and the introduction of mindfulness.

## 2019-01-08 DIAGNOSIS — M461 Sacroiliitis, not elsewhere classified: Secondary | ICD-10-CM | POA: Diagnosis not present

## 2019-01-08 DIAGNOSIS — M1611 Unilateral primary osteoarthritis, right hip: Secondary | ICD-10-CM | POA: Diagnosis not present

## 2019-01-08 DIAGNOSIS — M47816 Spondylosis without myelopathy or radiculopathy, lumbar region: Secondary | ICD-10-CM | POA: Diagnosis not present

## 2019-01-12 NOTE — Progress Notes (Signed)
Office: [phone]  /  Fax: [phone]    Date: January 15, 2019   Time Seen: 9:00am Duration: 32 minutes Provider: Glennie Isle, Psy.D. Type of Session: Individual Therapy  Type of Contact: Face-to-face  HPI: Elizabethwas referred by Dr. Lillia Carmel to a positive depression screen. Per the note for the initial visit withDr. Desmond Dike November 03, 2018,"Tonya Clarke has significant emotional eating and a PHQ-9 score of 21. Tonya Clarke notes significant stress eating, especially with anxiety." In addition, per the note for the initial visit withDr. Desmond Dike November 03, 2018,Tonya Clarke has been heavy most of her life, and shestarted gaining weight in 1988. Herheaviest weight ever was 205pounds.During the initial appointment with Dr. Leafy Ro, Tonya Clarke reported experiencing the following:significant food cravings issues; snackingfrequently in the evenings; skippingmeals frequently;frequently drinking liquids with calories;sometimesmakingpoor food choices;problems with excessive hunger;frequently eatinglarger portions than normal;binge eating behaviors; andstrugglingwith emotional eating.  During the initial appointment with this provider, Tonya Clarke shared she looks for food when she is anxious. She also described herself as a "grazer." She noted the last episode of emotional eating was approximatelytwoweeks ago due to her her "high stress job." She shared she ate something from the vending machine at work. Tonya Clarke indicated she tends to crave chocolate and salty foods. Moreover, Tonya Clarke reported she began engaging in emotional eating [id number] while she was going through a divorce. She denied a history of binge eating and overeating. Tonya Clarke also denied a history of purging and engagement in other compensatory strategies. She has never been diagnosed with an eating disorder. Furthermore, Elizabethwas asked to complete a questionnaire assessing various  behaviors related to emotional eating. Elizabethendorsed the following: overeat when you are celebrating, eat certain foods when you are anxious, stressed, depressed, or your feelings are hurt, use food to help you cope with emotional situations, find food is comforting to you and overeat frequently when you are bored or lonely.  During today's appointment, Tonya Clarke denied any episodes of emotional eating since the last appointment with this provider, but noted an increase in anxiety.   Session Content: Session focused on the following treatment goal: decrease emotional eating. The session was initiated with the administration of the PHQ-9 and GAD-7, as well as a brief check-in. Tonya Clarke shared a belief that there has been an increase in her anxiety, but is unsure where it is stemming from. She indicated it started "within the last two weeks," but could not recall anything significant happening around that time. Tonya Clarke discussed feeling "rushed," difficulty focusing, and feeling overwhelmed. She discussed "slowing things down and prioritizing tasks" has been helpful. She could not recall anything that has made it worse. The aforementioned was explored further, and Tonya Clarke acknowledged difficulty adjusting going back to work after being off of work for a week and a half during the holidays. Moreover, she noted an increase in back pain, which has impacted her ability to sleep. Regarding eating, Tonya Clarke denied experiencing emotional eating. Due to the recent increase in anxiety, psychoeducation regarding mindfulness was provided. A handout was provided to [facility] with further information regarding mindfulness, including exercises. This provider also explained the benefit of mindfulness as it relates to emotional eating. [name] was encouraged to engage in the provided exercises between now and the next appointment with this provider. Tonya Clarke agreed. She was led through an exercise involving her  senses. Tonya Clarke was receptive to today's session as evidenced by openness to sharing, responsiveness to feedback, and willingness to engage in mindfulness.  Mental Status Examination: Tonya Clarke arrived on time for the  appointment. She presented as appropriately dressed and groomed. Tonya Clarke appeared her stated age and demonstrated adequate orientation to time, place, person, and purpose of the appointment. She also demonstrated appropriate eye contact. No psychomotor abnormalities or behavioral peculiarities noted. Her mood was euthymic with congruent affect. Her thought processes were logical, linear, and goal-directed. No hallucinations, delusions, bizarre thinking or behavior reported or observed. Judgment, insight, and impulse control appeared to be grossly intact. There was no evidence of paraphasias (i.e., errors in speech, gross mispronunciations, and word substitutions), repetition deficits, or disturbances in volume or prosody (i.e., rhythm and intonation). There was no evidence of attention or memory impairments. Tonya Clarke denied current suicidal and homicidal ideation, intent or plan.  Structured Assessment Results: The Patient Health Questionnaire-9 (PHQ-9) is a self-report measure that assesses symptoms and severity of depression over the course of the last two weeks. Tonya Clarke obtained a score of 8 suggesting mild depression. Tonya Clarke finds the endorsed symptoms to be somewhat difficult. Depression screen PHQ 2/9 01/15/2019  Decreased Interest 2  Down, Depressed, Hopeless 2  PHQ - 2 Score 4  Altered sleeping 1  Tired, decreased energy 2  Change in appetite 0  Feeling bad or failure about yourself  0  Trouble concentrating 1  Moving slowly or fidgety/restless 0  Suicidal thoughts 0  PHQ-9 Score 8  Difficult doing work/chores -   The Generalized Anxiety Disorder-7 (GAD-7) is a brief self-report measure that assesses symptoms of anxiety over the course of the last two weeks.  Tonya Clarke obtained a score of 4 suggesting minimal anxiety. GAD 7 : Generalized Anxiety Score 01/15/2019  Nervous, Anxious, on Edge 1  Control/stop worrying 1  Worry too much - different things 1  Trouble relaxing 1  Restless 0  Easily annoyed or irritable 0  Afraid - awful might happen 0  Total GAD 7 Score 4  Anxiety Difficulty Somewhat difficult   Interventions:  Administration of PHQ-9 and GAD-7 for symptom monitoring Review of content from the previous session Empathic reflections and validation Psychoeducation regarding mindfulness Mindfulness exercise Brief chart review  DSM-5 Diagnosis: 311 (F32.8) Other Specified Depressive Disorder, Emotional Eating Behaviors  Treatment Goal & Progress: During the initial appointment with this provider, the following treatment goal was established: decrease emotional eating. Tonya Clarke has demonstrated progress in her goal as evidenced by increased awareness of hunger patterns and triggers for emotional eating. She also denied engaging in emotional eating since the last appointment with this provider. Moreover, Tonya Clarke demonstrates willingness to engage in learned skills.  Plan: Tonya Clarke continues to appear able and willing to participate as evidenced by engagement in reciprocal conversation, and asking questions for clarification as appropriate. The next appointment will be scheduled in two weeks. The next session will focus further on mindfulness.

## 2019-01-15 ENCOUNTER — Encounter (INDEPENDENT_AMBULATORY_CARE_PROVIDER_SITE_OTHER): Payer: Self-pay | Admitting: Family Medicine

## 2019-01-15 ENCOUNTER — Ambulatory Visit (INDEPENDENT_AMBULATORY_CARE_PROVIDER_SITE_OTHER): Payer: BLUE CROSS/BLUE SHIELD | Admitting: Family Medicine

## 2019-01-15 ENCOUNTER — Ambulatory Visit (INDEPENDENT_AMBULATORY_CARE_PROVIDER_SITE_OTHER): Payer: BLUE CROSS/BLUE SHIELD | Admitting: Psychology

## 2019-01-15 VITALS — BP 151/83 | HR 51 | Temp 98.0°F | Ht 62.0 in | Wt 204.0 lb

## 2019-01-15 DIAGNOSIS — Z9189 Other specified personal risk factors, not elsewhere classified: Secondary | ICD-10-CM | POA: Diagnosis not present

## 2019-01-15 DIAGNOSIS — E559 Vitamin D deficiency, unspecified: Secondary | ICD-10-CM | POA: Diagnosis not present

## 2019-01-15 DIAGNOSIS — I10 Essential (primary) hypertension: Secondary | ICD-10-CM | POA: Diagnosis not present

## 2019-01-15 DIAGNOSIS — Z6837 Body mass index (BMI) 37.0-37.9, adult: Secondary | ICD-10-CM

## 2019-01-15 DIAGNOSIS — F3289 Other specified depressive episodes: Secondary | ICD-10-CM

## 2019-01-15 MED ORDER — VITAMIN D (ERGOCALCIFEROL) 1.25 MG (50000 UNIT) PO CAPS: 50000.0000 [IU] | ORAL_CAPSULE | ORAL | 0 refills | Status: DC

## 2019-01-19 NOTE — Progress Notes (Signed)
Office: [phone]  /  Fax: [phone]   HPI:   Chief Complaint: OBESITY Tonya Clarke is here to discuss her progress with her obesity treatment plan. She is on the keep a food journal with 1000-1200 calories and 70 grams of protein daily and is following her eating plan approximately 85 % of the time. She states she is walking for 15-30 minutes 6-7 times per week. Tonya Clarke continues to do well with weight loss, but states she is struggling with meal planning and prepping, and would like some advice on ways to do this easier.  Her weight is 204 lb (92.5 kg) today and has had a weight loss of 2 pounds over a period of 5 weeks since her last visit. She has lost 0 lbs since starting treatment with [place].  Vitamin D Deficiency Tonya Clarke has a diagnosis of vitamin D deficiency. She is stable on prescription Vit D, but level is not yet at goal. She denies nausea, vomiting or muscle weakness.  Hypertension Tonya Clarke is a 58 y.o. female with hypertension. Robie's blood pressure is elevated today. She is now on atenolol recently. She denies chest pain or headaches. She is working on weight loss to help control her blood pressure with the goal of decreasing her risk of heart attack and stroke. Tonya Clarke's blood pressure is not currently controlled.  At risk for cardiovascular disease Tonya Clarke is at a higher than average risk for cardiovascular disease due to obesity and hypertension. She currently denies any chest pain.  ASSESSMENT AND PLAN:  Vitamin D deficiency - Plan: Vitamin D, Ergocalciferol, (DRISDOL) 1.25 MG (50000 UT) CAPS capsule  Essential hypertension  At risk for heart disease  Class 2 severe obesity with serious comorbidity and body mass index (BMI) of 37.0 to 37.9 in adult, unspecified obesity type ([place])  PLAN:  Vitamin D Deficiency Tonya Clarke was informed that low vitamin D levels contributes to fatigue and are associated with obesity, breast, and colon cancer.  Tonya Clarke agrees to continue taking prescription Vit D @50 ,000 IU every week #4 and we will refill for 1 month. She will follow up for routine testing of vitamin D, at least 2-3 times per year. She was informed of the risk of over-replacement of vitamin D and agrees to not increase her dose unless she discusses this with [place] first. We will recheck labs in 1 month. Tonya Clarke agrees to follow up with our clinic in 2 weeks.  Hypertension We discussed sodium restriction, working on healthy weight loss, and a regular exercise program as the means to achieve improved blood pressure control. Tonya Clarke agreed with this plan and agreed to follow up as directed. We will continue to monitor her blood pressure as well as her progress with the above lifestyle modifications. She will continue diet, weight loss, and her medications, and will watch for signs of hypotension as she continues her lifestyle modifications. Lyris agrees to follow up with our clinic in 2 weeks and we will recheck blood pressure at that time.  Cardiovascular risk counselling Tonya Clarke was given extended (15 minutes) coronary artery disease prevention counseling today. She is 58 y.o. female and has risk factors for heart disease including obesity and hypertension. We discussed intensive lifestyle modifications today with an emphasis on specific weight loss instructions and strategies. Pt was also informed of the importance of increasing exercise and decreasing saturated fats to help prevent heart disease.  Obesity Tonya Clarke is currently in the action stage of change. As such, her goal is to continue with weight  loss efforts She has agreed to keep a food journal with 1200 calories and 70+ grams of protein daily Tonya Clarke has been instructed to work up to a goal of 150 minutes of combined cardio and strengthening exercise per week for weight loss and overall health benefits. We discussed the following Behavioral Modification Strategies today:  increasing lean protein intake, decreasing simple carbohydrates, work on meal planning and easy cooking plans, and keep a strict food journal   Tonya Clarke has agreed to follow up with our clinic in 2 weeks. She was informed of the importance of frequent follow up visits to maximize her success with intensive lifestyle modifications for her multiple health conditions.  ALLERGIES: Allergies  Allergen Reactions  . Aspirin Anaphylaxis  . Penicillins Anaphylaxis  . Amlodipine Itching    Makes skin crawl  . Hydrochlorothiazide Other (See Comments)    "makes skin burn"  . Lisinopril Other (See Comments)    Makes sinuses drain   . Red Dye     "makes my skin crawl"  . Sulfa Antibiotics Other (See Comments)    "makes my skin burn"  . Valtrex [Valacyclovir] Other (See Comments)    Joints swell  . Losartan Rash    MEDICATIONS: Current Outpatient Medications on File Prior to Visit  Medication Sig Dispense Refill  . atenolol (TENORMIN) 25 MG tablet Take 25 mg by mouth daily.    . cholecalciferol (VITAMIN D) 1000 units tablet Take 1,000 Units by mouth daily.    . Cyanocobalamin (VITAMIN B 12 PO) Take 1 capsule by mouth daily.    Marland Kitchen HYDROcodone-acetaminophen (NORCO/VICODIN) 5-325 MG tablet Take 1 tablet by mouth every 6 (six) hours as needed for moderate pain.    . Multiple Vitamin (MULTIVITAMIN WITH MINERALS) TABS tablet Take 1 tablet by mouth daily.    Marland Kitchen tiZANidine (ZANAFLEX) 4 MG capsule Take 4 mg by mouth 3 (three) times daily as needed for muscle spasms.     No current facility-administered medications on file prior to visit.     PAST MEDICAL HISTORY: Past Medical History:  Diagnosis Date  . Acid reflux   . Anemia   . Anxiety   . Chronic back pain   . Chronic SI joint pain   . Constipation   . Frequent urination   . Glaucoma   . HTN (hypertension)   . Infertility, female   . Leg edema   . No pertinent past medical history   . Right hip pain   . Right leg numbness     PAST  SURGICAL HISTORY: Past Surgical History:  Procedure Laterality Date  . BILATERAL SALPINGECTOMY  01/08/2013   Procedure: BILATERAL SALPINGECTOMY;  Surgeon: Marvene Staff, MD;  Location: [facility];  Service: Gynecology;  Laterality: Bilateral;  . CATARACT EXTRACTION    . CESAREAN SECTION    . DIAGNOSTIC LAPAROSCOPY  1980's  . DILATION AND CURETTAGE OF UTERUS  2011  . ROBOTIC ASSISTED TOTAL HYSTERECTOMY  01/08/2013   Procedure: ROBOTIC ASSISTED TOTAL HYSTERECTOMY;  Surgeon: Marvene Staff, MD;  Location: [facility];  Service: Gynecology;  Laterality: N/A;  3 hrs.  . WISDOM TOOTH EXTRACTION      SOCIAL HISTORY: Social History   Tobacco Use  . Smoking status: Former Smoker    Packs/day: 2.50    Years: 20.00    Pack years: 50.00    Last attempt to quit: 01/01/1995    Years since quitting: 24.0  . Smokeless tobacco: Never Used  Substance Use Topics  . Alcohol  use: Yes    Comment: occasional  . Drug use: No    FAMILY HISTORY: Family History  Problem Relation Age of Onset  . Breast cancer Cousin   . Obesity Mother   . Cancer Father     ROS: Review of Systems  Constitutional: Positive for weight loss.  Cardiovascular: Negative for chest pain.  Gastrointestinal: Negative for nausea and vomiting.  Musculoskeletal:       Negative muscle weakness  Neurological: Negative for headaches.    PHYSICAL EXAM: Blood pressure (!) 151/83, pulse (!) 51, temperature 98 F (36.7 C), temperature source Oral, height 5\' 2"  (1.575 m), weight 204 lb (92.5 kg), last menstrual period 12/19/2012, SpO2 99 %. Body mass index is 37.31 kg/m. Physical Exam Vitals signs reviewed.  Constitutional:      Appearance: Normal appearance. She is obese.  Cardiovascular:     Rate and Rhythm: Normal rate.     Pulses: Normal pulses.  Pulmonary:     Effort: Pulmonary effort is normal.     Breath sounds: Normal breath sounds.  Musculoskeletal: Normal range of motion.  Skin:    General: Skin is warm and  dry.  Neurological:     Mental Status: She is alert and oriented to person, place, and time.  Psychiatric:        Mood and Affect: Mood normal.        Behavior: Behavior normal.     RECENT LABS AND TESTS: BMET    Component Value Date/Time   NA 142 11/03/2018 1025   K 4.0 11/03/2018 1025   CL 102 11/03/2018 1025   CO2 24 11/03/2018 1025   GLUCOSE 98 11/03/2018 1025   GLUCOSE 130 (H) 01/09/2013 0540   BUN 10 11/03/2018 1025   CREATININE 0.72 11/03/2018 1025   CALCIUM 9.6 11/03/2018 1025   GFRNONAA 95 11/03/2018 1025   GFRAA 109 11/03/2018 1025   Lab Results  Component Value Date   HGBA1C 6.2 (H) 11/03/2018   Lab Results  Component Value Date   INSULIN 20.5 11/03/2018   CBC    Component Value Date/Time   WBC 7.8 11/03/2018 1025   WBC 12.5 (H) 01/09/2013 0540   RBC 4.32 11/03/2018 1025   RBC 4.11 01/09/2013 0540   HGB 12.9 11/03/2018 1025   HCT 38.6 11/03/2018 1025   PLT 175 01/09/2013 0540   MCV 89 11/03/2018 1025   MCH 29.9 11/03/2018 1025   MCH 29.9 01/09/2013 0540   MCHC 33.4 11/03/2018 1025   MCHC 32.6 01/09/2013 0540   RDW 13.2 11/03/2018 1025   LYMPHSABS 1.8 11/03/2018 1025   EOSABS 0.1 11/03/2018 1025   BASOSABS 0.0 11/03/2018 1025   Iron/TIBC/Ferritin/ %Sat No results found for: IRON, TIBC, FERRITIN, IRONPCTSAT Lipid Panel     Component Value Date/Time   CHOL 148 11/03/2018 1025   TRIG 75 11/03/2018 1025   HDL 72 11/03/2018 1025   LDLCALC 61 11/03/2018 1025   Hepatic Function Panel     Component Value Date/Time   PROT 6.9 11/03/2018 1025   ALBUMIN 4.3 11/03/2018 1025   AST 23 11/03/2018 1025   ALT 21 11/03/2018 1025   ALKPHOS 87 11/03/2018 1025   BILITOT 0.3 11/03/2018 1025      Component Value Date/Time   TSH 1.180 11/03/2018 1025      OBESITY BEHAVIORAL INTERVENTION VISIT  Today's visit was # 4   Starting weight: 202 lbs Starting date: 11/03/18 Today's weight : 204 lbs  Today's date: 01/15/2019 Total lbs lost  to date:  0    ASK: We discussed the diagnosis of obesity with [name] today and [name] agreed to give [place] permission to discuss obesity behavioral modification therapy today.  ASSESS: Sadiyah has the diagnosis of obesity and her BMI today is 40.3 Tomeka is in the action stage of change   ADVISE: Xela was educated on the multiple health risks of obesity as well as the benefit of weight loss to improve her health. She was advised of the need for long term treatment and the importance of lifestyle modifications to improve her current health and to decrease her risk of future health problems.  AGREE: Multiple dietary modification options and treatment options were discussed and  Derra agreed to follow the recommendations documented in the above note.  ARRANGE: Geral was educated on the importance of frequent visits to treat obesity as outlined per CMS and USPSTF guidelines and agreed to schedule her next follow up appointment today.  I, Trixie Dredge, am acting as transcriptionist for Dennard Nip, MD  I have reviewed the above documentation for accuracy and completeness, and I agree with the above. -Dennard Nip, MD

## 2019-01-20 NOTE — Progress Notes (Signed)
Office: [phone]  /  Fax: [phone]    Date: January 28, 2019   Time Seen: 4:30pm Duration: 30 minutes Provider: Glennie Isle, Psy.D. Type of Session: Individual Therapy  Type of Contact: Face-to-face  Session Content: Tonya Clarke is a 58 y.o. female presenting for a follow-up appointment to address the previously established treatment goal of decreasing emotional eating. The session was initiated with the administration of the PHQ-9 and GAD-7, as well as a brief check-in. [name] shared she lost one pound since the last appointment with this provider; however, she expressed concern regarding keeping weight off that she has lost. Thus, this was explored, and [name] was engaged in thought challenging and reframing via discussing evidence for and evidence against the aforementioned thought. Moreover, [name] reported experiencing difficulty journaling what she is eating; however, she noted it is "not difficult" to journal "what is triggering" her to eat. She discussed exploring her eating habits at work and on the weekends. This provider reviewed physical versus emotional hunger with her, as well as triggers for emotional eating. In addition, this provider discussed how practicing mindfulness can help with the aforementioned. More specifically, psychoeducation regarding the hunger and satisfaction scale was provided, and a handout was given to [place]. Furthermore, this provider discussed termination planning, including the option for a referral for longer-term therapeutic services.  [name] was receptive to today's session as evidenced by openness to sharing, responsiveness to feedback, and willingness to continue practicing mindfulness. Additionally, session concluded with [name] noting, "You helped clear things up."   Mental Status Examination: Tonya Clarke arrived on time for the appointment. She presented as appropriately dressed and groomed. [name] appeared her stated age and  demonstrated adequate orientation to time, place, person, and purpose of the appointment. She also demonstrated appropriate eye contact. No psychomotor abnormalities or behavioral peculiarities noted. Her mood was euthymic with congruent affect. Her thought processes were logical, linear, and goal-directed. No hallucinations, delusions, bizarre thinking or behavior reported or observed. Judgment, insight, and impulse control appeared to be grossly intact. There was no evidence of paraphasias (i.e., errors in speech, gross mispronunciations, and word substitutions), repetition deficits, or disturbances in volume or prosody (i.e., rhythm and intonation). There was no evidence of attention or memory impairments. [name] denied current suicidal and homicidal ideation, plan and intent.   Structured Assessment Results: The Patient Health Questionnaire-9 (PHQ-9) is a self-report measure that assesses symptoms and severity of depression over the course of the last two weeks. Tonya Clarke obtained a score of 3 suggesting minimal depression. Tonya Clarke finds the endorsed symptoms to be not difficult at all. Depression screen [facility] 2/9 01/28/2019  Decreased Interest 1  Down, Depressed, Hopeless 0  PHQ - 2 Score 1  Altered sleeping 0  Tired, decreased energy 1  Change in appetite 0  Feeling bad or failure about yourself  0  Trouble concentrating 1  Moving slowly or fidgety/restless 0  Suicidal thoughts 0  PHQ-9 Score 3  Difficult doing work/chores -   The Generalized Anxiety Disorder-7 (GAD-7) is a brief self-report measure that assesses symptoms of anxiety over the course of the last two weeks. Tonya Clarke obtained a score of zero. GAD 7 : Generalized Anxiety Score 01/28/2019  Nervous, Anxious, on Edge 0  Control/stop worrying 0  Worry too much - different things 0  Trouble relaxing 0  Restless 0  Easily annoyed or irritable 0  Afraid - awful might happen 0  Total GAD 7 Score 0  Anxiety Difficulty Not  difficult at all   Interventions:  Administration of PHQ-9 and GAD-7 for symptom monitoring Empathic reflections and validation Reviewed learned skills Termination planning Discussed option for a referral for longer-term therapeutic services Psychoeducation regarding the hunger and satisfaction scale Brief chart review  Thought challenging and reframing  DSM-5 Diagnosis: 311 (F32.8) Other Specified Depressive Disorder, Emotional Eating Behaviors  Treatment Goal & Progress: During the initial appointment with this provider, the following treatment goal was established: decrease emotional eating. Tonya Clarke has demonstrated progress in her goal as evidenced by increased awareness of hunger patterns, and triggers for emotional eating. She reported engaging in learned skills, and continues to demonstrate willingness to engage in learned skills.   Plan: [name] continues to appear able and willing to participate as evidenced by engagement in reciprocal conversation, and asking questions for clarification as appropriate. The next appointment will be scheduled in two weeks. The next session will focus further on mindfulness and the possible introduction of pleasurable activities to further assist with coping.

## 2019-01-28 ENCOUNTER — Ambulatory Visit (INDEPENDENT_AMBULATORY_CARE_PROVIDER_SITE_OTHER): Payer: BLUE CROSS/BLUE SHIELD | Admitting: Family Medicine

## 2019-01-28 ENCOUNTER — Ambulatory Visit (INDEPENDENT_AMBULATORY_CARE_PROVIDER_SITE_OTHER): Payer: BLUE CROSS/BLUE SHIELD | Admitting: Psychology

## 2019-01-28 ENCOUNTER — Encounter (INDEPENDENT_AMBULATORY_CARE_PROVIDER_SITE_OTHER): Payer: Self-pay | Admitting: Family Medicine

## 2019-01-28 VITALS — BP 161/83 | HR 60 | Temp 98.1°F | Ht 62.0 in | Wt 203.0 lb

## 2019-01-28 DIAGNOSIS — I10 Essential (primary) hypertension: Secondary | ICD-10-CM | POA: Diagnosis not present

## 2019-01-28 DIAGNOSIS — F3289 Other specified depressive episodes: Secondary | ICD-10-CM | POA: Diagnosis not present

## 2019-01-28 DIAGNOSIS — Z6837 Body mass index (BMI) 37.0-37.9, adult: Secondary | ICD-10-CM | POA: Diagnosis not present

## 2019-01-29 NOTE — Progress Notes (Signed)
Office: [phone]  /  Fax: [phone]   HPI:   Chief Complaint: OBESITY Tonya Clarke is here to discuss her progress with her obesity treatment plan. She is keeping a food journal with 1200 calories and 70 grams of protein  and is following her eating plan approximately 90 to 95 % of the time. She states she is walking 15 to 30 minutes 7 times per week. [name] states that she is doing better with journaling and trying to meet her calorie and protein goals. She still forgets to journal on the weekend.  Her weight is 203 lb (92.1 kg) today and has had a weight loss of 1 pound over a period of 2 weeks since her last visit. She has lost 0 lbs since starting treatment with [place].  Hypertension Tonya Clarke is a 58 y.o. female with hypertension. [name]'s blood pressure is elevated today as she is forgetting her medications frequently. She is working on weight loss to help control her blood pressure with the goal of decreasing her risk of heart attack and stroke. [name] denies chest pain or headache.  ASSESSMENT AND PLAN:  Essential hypertension  Class 2 severe obesity with serious comorbidity and body mass index (BMI) of 37.0 to 37.9 in adult, unspecified obesity type ([place])  PLAN:  Hypertension We discussed sodium restriction, working on healthy weight loss, and a regular exercise program as the means to achieve improved blood pressure control. We will continue to monitor her blood pressure as well as her progress with the above lifestyle modifications. She will continue her medications as prescribed and will watch for signs of hypotension as she continues her lifestyle modifications. [name] was reminded to take her atenolol regularly and discussed ways to help her remember. She will continue her diet and weight loss. She agreed with this plan and agreed to follow up as directed in 3 weeks.  I spent > than 50% of the 25 minute visit on counseling as documented in the  note.  Obesity Tonya Clarke is currently in the action stage of change. As such, her goal is to continue with weight loss efforts. She has agreed to keep a food journal with 1200 calories and 70 grams of protein.  [name] has been instructed to work up to a goal of 150 minutes of combined cardio and strengthening exercise per week for weight loss and overall health benefits. We discussed the following Behavioral Modification Strategies today: increasing lean protein intake, decreasing simple carbohydrates, no skipping meals, and decreasing sodium intake.  Tonya Clarke has agreed to follow up with our clinic in 3 weeks. She was informed of the importance of frequent follow up visits to maximize her success with intensive lifestyle modifications for her multiple health conditions.  ALLERGIES: Allergies  Allergen Reactions  . Aspirin Anaphylaxis  . Penicillins Anaphylaxis  . Amlodipine Itching    Makes skin crawl  . Hydrochlorothiazide Other (See Comments)    "makes skin burn"  . Lisinopril Other (See Comments)    Makes sinuses drain   . Red Dye     "makes my skin crawl"  . Sulfa Antibiotics Other (See Comments)    "makes my skin burn"  . Valtrex [Valacyclovir] Other (See Comments)    Joints swell  . Losartan Rash    MEDICATIONS: Current Outpatient Medications on File Prior to Visit  Medication Sig Dispense Refill  . atenolol (TENORMIN) 25 MG tablet Take 25 mg by mouth daily.    . cholecalciferol (VITAMIN D) 1000 units tablet Take 1,000  Units by mouth daily.    . Cyanocobalamin (VITAMIN B 12 PO) Take 1 capsule by mouth daily.    Marland Kitchen HYDROcodone-acetaminophen (NORCO/VICODIN) 5-325 MG tablet Take 1 tablet by mouth every 6 (six) hours as needed for moderate pain.    . Multiple Vitamin (MULTIVITAMIN WITH MINERALS) TABS tablet Take 1 tablet by mouth daily.    Marland Kitchen tiZANidine (ZANAFLEX) 4 MG capsule Take 4 mg by mouth 3 (three) times daily as needed for muscle spasms.    . Vitamin D,  Ergocalciferol, (DRISDOL) 1.25 MG (50000 UT) CAPS capsule Take 1 capsule (50,000 Units total) by mouth every 7 (seven) days. 4 capsule 0   No current facility-administered medications on file prior to visit.     PAST MEDICAL HISTORY: Past Medical History:  Diagnosis Date  . Acid reflux   . Anemia   . Anxiety   . Chronic back pain   . Chronic SI joint pain   . Constipation   . Frequent urination   . Glaucoma   . HTN (hypertension)   . Infertility, female   . Leg edema   . No pertinent past medical history   . Right hip pain   . Right leg numbness     PAST SURGICAL HISTORY: Past Surgical History:  Procedure Laterality Date  . BILATERAL SALPINGECTOMY  01/08/2013   Procedure: BILATERAL SALPINGECTOMY;  Surgeon: Marvene Staff, MD;  Location: [facility];  Service: Gynecology;  Laterality: Bilateral;  . CATARACT EXTRACTION    . CESAREAN SECTION    . DIAGNOSTIC LAPAROSCOPY  1980's  . DILATION AND CURETTAGE OF UTERUS  2011  . ROBOTIC ASSISTED TOTAL HYSTERECTOMY  01/08/2013   Procedure: ROBOTIC ASSISTED TOTAL HYSTERECTOMY;  Surgeon: Marvene Staff, MD;  Location: [facility];  Service: Gynecology;  Laterality: N/A;  3 hrs.  . WISDOM TOOTH EXTRACTION      SOCIAL HISTORY: Social History   Tobacco Use  . Smoking status: Former Smoker    Packs/day: 2.50    Years: 20.00    Pack years: 50.00    Last attempt to quit: 01/01/1995    Years since quitting: 24.0  . Smokeless tobacco: Never Used  Substance Use Topics  . Alcohol use: Yes    Comment: occasional  . Drug use: No    FAMILY HISTORY: Family History  Problem Relation Age of Onset  . Breast cancer Cousin   . Obesity Mother   . Cancer Father     ROS: Review of Systems  Constitutional: Positive for weight loss.  Cardiovascular: Negative for chest pain.  Neurological: Negative for headaches.    PHYSICAL EXAM: Blood pressure (!) 161/83, pulse 60, temperature 98.1 F (36.7 C), temperature source Oral, height 5\' 2"   (1.575 m), weight 203 lb (92.1 kg), last menstrual period 12/19/2012, SpO2 99 %. Body mass index is 37.13 kg/m. Physical Exam Vitals signs reviewed.  Constitutional:      Appearance: Normal appearance. She is obese.  Cardiovascular:     Rate and Rhythm: Normal rate.  Pulmonary:     Effort: Pulmonary effort is normal.  Musculoskeletal: Normal range of motion.  Skin:    General: Skin is warm and dry.  Neurological:     Mental Status: She is alert and oriented to person, place, and time.  Psychiatric:        Mood and Affect: Mood normal.        Behavior: Behavior normal.     RECENT LABS AND TESTS: BMET    Component Value  Date/Time   NA 142 11/03/2018 1025   K 4.0 11/03/2018 1025   CL 102 11/03/2018 1025   CO2 24 11/03/2018 1025   GLUCOSE 98 11/03/2018 1025   GLUCOSE 130 (H) 01/09/2013 0540   BUN 10 11/03/2018 1025   CREATININE 0.72 11/03/2018 1025   CALCIUM 9.6 11/03/2018 1025   GFRNONAA 95 11/03/2018 1025   GFRAA 109 11/03/2018 1025   Lab Results  Component Value Date   HGBA1C 6.2 (H) 11/03/2018   Lab Results  Component Value Date   INSULIN 20.5 11/03/2018   CBC    Component Value Date/Time   WBC 7.8 11/03/2018 1025   WBC 12.5 (H) 01/09/2013 0540   RBC 4.32 11/03/2018 1025   RBC 4.11 01/09/2013 0540   HGB 12.9 11/03/2018 1025   HCT 38.6 11/03/2018 1025   PLT 175 01/09/2013 0540   MCV 89 11/03/2018 1025   MCH 29.9 11/03/2018 1025   MCH 29.9 01/09/2013 0540   MCHC 33.4 11/03/2018 1025   MCHC 32.6 01/09/2013 0540   RDW 13.2 11/03/2018 1025   LYMPHSABS 1.8 11/03/2018 1025   EOSABS 0.1 11/03/2018 1025   BASOSABS 0.0 11/03/2018 1025   Iron/TIBC/Ferritin/ %Sat No results found for: IRON, TIBC, FERRITIN, IRONPCTSAT Lipid Panel     Component Value Date/Time   CHOL 148 11/03/2018 1025   TRIG 75 11/03/2018 1025   HDL 72 11/03/2018 1025   LDLCALC 61 11/03/2018 1025   Hepatic Function Panel     Component Value Date/Time   PROT 6.9 11/03/2018 1025    ALBUMIN 4.3 11/03/2018 1025   AST 23 11/03/2018 1025   ALT 21 11/03/2018 1025   ALKPHOS 87 11/03/2018 1025   BILITOT 0.3 11/03/2018 1025      Component Value Date/Time   TSH 1.180 11/03/2018 1025   Results for MARKAN, CAZAREZ (MRN [id number]) as of 01/29/2019 12:16  Ref. Range 11/03/2018 10:25  Vitamin D, 25-Hydroxy Latest Ref Range: 30.0 - 100.0 ng/mL 21.9 (L)   OBESITY BEHAVIORAL INTERVENTION VISIT  Today's visit was # 5   Starting weight: 202 lbs Starting date: 11/03/18 Today's weight : Weight: 203 lb (92.1 kg)  Today's date: 01/28/2019 Total lbs lost to date: 0  ASK: We discussed the diagnosis of obesity with [name] today and [name] agreed to give [place] permission to discuss obesity behavioral modification therapy today.  ASSESS: Cass has the diagnosis of obesity and her BMI today is 37.1. Christiana is in the action stage of change.   ADVISE: Jae was educated on the multiple health risks of obesity as well as the benefit of weight loss to improve her health. She was advised of the need for long term treatment and the importance of lifestyle modifications to improve her current health and to decrease her risk of future health problems.  AGREE: Multiple dietary modification options and treatment options were discussed and Aryanah agreed to follow the recommendations documented in the above note.  ARRANGE: Leyla was educated on the importance of frequent visits to treat obesity as outlined per CMS and USPSTF guidelines and agreed to schedule her next follow up appointment today.  IMarcille Blanco, CMA, am acting as transcriptionist for Starlyn Skeans, MD  I have reviewed the above documentation for accuracy and completeness, and I agree with the above. -Dennard Nip, MD

## 2019-02-19 ENCOUNTER — Ambulatory Visit (INDEPENDENT_AMBULATORY_CARE_PROVIDER_SITE_OTHER): Payer: BLUE CROSS/BLUE SHIELD | Admitting: Family Medicine

## 2019-02-19 ENCOUNTER — Ambulatory Visit (INDEPENDENT_AMBULATORY_CARE_PROVIDER_SITE_OTHER): Payer: BLUE CROSS/BLUE SHIELD | Admitting: Psychology

## 2019-02-19 ENCOUNTER — Encounter (INDEPENDENT_AMBULATORY_CARE_PROVIDER_SITE_OTHER): Payer: Self-pay | Admitting: Family Medicine

## 2019-02-19 VITALS — BP 177/90 | HR 70 | Ht 62.0 in | Wt 205.0 lb

## 2019-02-19 DIAGNOSIS — I10 Essential (primary) hypertension: Secondary | ICD-10-CM

## 2019-02-19 DIAGNOSIS — F3289 Other specified depressive episodes: Secondary | ICD-10-CM | POA: Diagnosis not present

## 2019-02-19 DIAGNOSIS — Z9189 Other specified personal risk factors, not elsewhere classified: Secondary | ICD-10-CM | POA: Diagnosis not present

## 2019-02-19 DIAGNOSIS — E559 Vitamin D deficiency, unspecified: Secondary | ICD-10-CM

## 2019-02-19 DIAGNOSIS — R7303 Prediabetes: Secondary | ICD-10-CM | POA: Diagnosis not present

## 2019-02-19 DIAGNOSIS — Z6837 Body mass index (BMI) 37.0-37.9, adult: Secondary | ICD-10-CM

## 2019-02-19 NOTE — Progress Notes (Signed)
Office: [phone]  /  Fax: [phone]    Date: February 19, 2019   Time Seen: 4:00pm Duration: 28 minutes Provider: Glennie Isle, Psy.D. Type of Session: Individual Therapy  Type of Contact: Face-to-face  Session Content: Tonya Clarke is a 58 y.o. female presenting for a follow-up appointment to address the previously established treatment goal of decreasing emotional eating as well as termination of services with this provider. The session was initiated with the administration of the PHQ-9 and GAD-7, as well as a brief check-in. Tonya Clarke shared her father was recently hospitalized and she discussed ongoing uncertainty related to work. Regarding eating, Tonya Clarke shared, "It's going fairly well." However, she discussed "forgetting to journal" at times. In regard to mindfulness, Tonya Clarke shared, "It is going better than I anticipated." This was explored further. She described being more in the moment, and noted it has helped reduce emotional eating. Since the last appointment, Tonya Clarke reported experiencing a couple episodes of emotional eating; however, she noted making better choices. Due to ongoing work related stressors, psychoeducation regarding pleasurable activities, including its impact on emotional eating was provided. Tonya Clarke was provided with a handout with various options of pleasurable activities, and was encouraged to engage in an activity a day as well as when experiencing emotional hunger. Tonya Clarke agreed. In addition, she was engaged in a mindfulness exercise involving her current emotion, as Tonya Clarke described frequently experiencing frustration and irritability in the work place. A handout was provided for the exercise. Tonya Clarke was receptive to today's session as evidenced by openness to sharing, responsiveness to feedback, and engagement in a mindfulness exercise.  Mental Status Examination: Tonya Clarke arrived on time for the appointment. She presented as appropriately  dressed and groomed. Tonya Clarke appeared her stated age and demonstrated adequate orientation to time, place, person, and purpose of the appointment. She also demonstrated appropriate eye contact. No psychomotor abnormalities or behavioral peculiarities noted. Her mood was euthymic with congruent affect. Her thought processes were logical, linear, and goal-directed. No hallucinations, delusions, bizarre thinking or behavior reported or observed. Judgment, insight, and impulse control appeared to be grossly intact. There was no evidence of paraphasias (i.e., errors in speech, gross mispronunciations, and word substitutions), repetition deficits, or disturbances in volume or prosody (i.e., rhythm and intonation). There was no evidence of attention or memory impairments. Tonya Clarke denied current suicidal and homicidal ideation, plan and intent.   Structured Assessment Results: The Patient Health Questionnaire-9 (PHQ-9) is a self-report measure that assesses symptoms and severity of depression over the course of the last two weeks. Tonya Clarke obtained a score of zero. Depression screen [facility] 2/9 02/19/2019  Decreased Interest 0  Down, Depressed, Hopeless 0  PHQ - 2 Score 0  Altered sleeping 0  Tired, decreased energy 0  Change in appetite 0  Feeling bad or failure about yourself  0  Trouble concentrating 0  Moving slowly or fidgety/restless 0  Suicidal thoughts 0  PHQ-9 Score 0  Difficult doing work/chores -   The Generalized Anxiety Disorder-7 (GAD-7) is a brief self-report measure that assesses symptoms of anxiety over the course of the last two weeks. Tonya Clarke obtained a score of zero. GAD 7 : Generalized Anxiety Score 02/19/2019  Nervous, Anxious, on Edge 0  Control/stop worrying 0  Worry too much - different things 0  Trouble relaxing 0  Restless 0  Easily annoyed or irritable 0  Afraid - awful might happen 0  Total GAD 7 Score 0  Anxiety Difficulty Not difficult at all   Interventions:    Administration  of PHQ-9 and GAD-7 for symptom monitoring Review of content from the previous session Empathic reflections and validation Psychoeducation regarding pleasurable activities Mindfulness exercise Positive reinforcement Brief chart review  DSM-5 Diagnosis: 311 (F32.8) Other Specified Depressive Disorder, Emotional Eating Behaviors  Treatment Goal & Progress: During the initial appointment with this provider, the following treatment goal was established: decrease emotional eating. Tonya Clarke has demonstrated progress in her goal as evidenced by increased awareness of hunger patterns and triggers for emotional eating. Since the onset of treatment with this provider, Tonya Clarke has described a decrease in emotional eating and noted she is making better choices when experiencing emotional hunger. She continues to demonstrate willingness to engage in learned skills.   Plan: Tonya Clarke requested to terminate services with this provider, as she believes she has the tools to assist with coping with emotional eating. She verbally acknowledged understanding that should she need therapeutic services in the future, she can inform the clinic.

## 2019-02-21 ENCOUNTER — Other Ambulatory Visit (INDEPENDENT_AMBULATORY_CARE_PROVIDER_SITE_OTHER): Payer: Self-pay | Admitting: Family Medicine

## 2019-02-21 DIAGNOSIS — E559 Vitamin D deficiency, unspecified: Secondary | ICD-10-CM

## 2019-02-23 MED ORDER — VITAMIN D (ERGOCALCIFEROL) 1.25 MG (50000 UNIT) PO CAPS: 50000.0000 [IU] | ORAL_CAPSULE | ORAL | 0 refills | Status: DC

## 2019-02-23 NOTE — Progress Notes (Signed)
Office: [phone]  /  Fax: [phone]   HPI:   Chief Complaint: OBESITY Tonya Clarke is here to discuss her progress with her obesity treatment plan. She is keeping a food journal with 1200 calories and 70+ grams of protein and is following her eating plan approximately 65 % of the time. She states she is walking 30 minutes 7 times per week. Ollie is starting to journal more, but is struggling to journal all the time. She is trying to eat more plant based, but instead is increasing carbs, fat, and is not meeting her protein goals.  Her weight is 205 lb (93 kg) today and has had a weight gain of 2 pounds over a period of 3 weeks since her last visit. She has lost 0 lbs since starting treatment with [place].  Hypertension MEKA LEWAN is a 58 y.o. female with hypertension. Jazzy's blood pressure is currently elevated today and she is on atenolol. Marvetta forgot to take her medications and states this happens frequently. She is working on weight loss to help control her blood pressure with the goal of decreasing her risk of heart attack and stroke.    At risk for cardiovascular disease Evvie is at a higher than average risk for cardiovascular disease due to hypertension and obesity.   Pre-Diabetes Matea has a diagnosis of pre-diabetes based on her elevated Hgb A1c and was informed this puts her at greater risk of developing diabetes. She is not taking metformin currently and is working on her diet, but is struggling to reduce carbs. She denies polyphagia in the morning, but admits polyphagia in the afternoon.   Vitamin D deficiency Nahia has a diagnosis of vitamin D deficiency. She is currently stable on vit D, but is not yet at goal.   ASSESSMENT AND PLAN:  Essential hypertension  Prediabetes  Vitamin D deficiency - Plan: Vitamin D, Ergocalciferol, (DRISDOL) 1.25 MG (50000 UT) CAPS capsule  At risk for heart disease  Class 2 severe obesity with serious  comorbidity and body mass index (BMI) of 37.0 to 37.9 in adult, unspecified obesity type ([place])  PLAN:  Hypertension We discussed sodium restriction, working on healthy weight loss, and a regular exercise program as the means to achieve improved blood pressure control. We also discussed strategies to remember her pills and the importance of blood pressure control to avoid cerebrovascular accident or myocardial infarction. We will continue to monitor her blood pressure as well as her progress with the above lifestyle modifications. She will continue her medications as prescribed and will watch for signs of hypotension as she continues her lifestyle modifications. Mitsuko agreed with this plan and agreed to follow up as directed in 2 to 3 weeks.  Cardiovascular risk counseling Ellice was given extended (15 minutes) coronary artery disease prevention counseling today. She is 58 y.o. female and has risk factors for heart disease including hypertension and obesity. We discussed intensive lifestyle modifications today with an emphasis on specific weight loss instructions and strategies. Pt was also informed of the importance of increasing exercise and decreasing saturated fats to help prevent heart disease.  Pre-Diabetes Octavie will continue to work on weight loss, exercise, and decreasing simple carbohydrates in her diet to help decrease the risk of diabetes. We discussed metformin including benefits and risks. She was informed that eating too many simple carbohydrates or too many calories at one sitting increases the likelihood of GI side effects. Ardith agreed to continue her diet and exercise and she may need to start  metformin in the future. We will recheck labs in 2 months. Labella agreed to follow up with [place] as directed to monitor her progress.  Vitamin D Deficiency Kashawn was informed that low vitamin D levels contributes to fatigue and are associated with obesity, breast, and colon  cancer. Deysi agrees to continue to take prescription Vit D @50 ,000 IU every week #4 with no refills and will follow up for routine testing of vitamin D, at least 2-3 times per year. She was informed of the risk of over-replacement of vitamin D and agrees to not increase her dose unless she discusses this with [place] first. Diann agrees to follow up in 2 to 3 weeks as directed.  Obesity Kaeleigh is currently in the action stage of change. As such, her goal is to continue with weight loss efforts. She has agreed to keep a food journal with 1200 calories and 70+ grams of protein.  Kirbie has been instructed to work up to a goal of 150 minutes of combined cardio and strengthening exercise per week for weight loss and overall health benefits. We discussed the following Behavioral Modification Strategies today: increasing lean protein intake, better snacking choices, keeping a strict food journal, and work on meal planning and easy cooking plans.  Dori has agreed to follow up with our clinic in 2 to 3 weeks. She was informed of the importance of frequent follow up visits to maximize her success with intensive lifestyle modifications for her multiple health conditions.  ALLERGIES: Allergies  Allergen Reactions  . Aspirin Anaphylaxis  . Penicillins Anaphylaxis  . Amlodipine Itching    Makes skin crawl  . Hydrochlorothiazide Other (See Comments)    "makes skin burn"  . Lisinopril Other (See Comments)    Makes sinuses drain   . Red Dye     "makes my skin crawl"  . Sulfa Antibiotics Other (See Comments)    "makes my skin burn"  . Valtrex [Valacyclovir] Other (See Comments)    Joints swell  . Losartan Rash    MEDICATIONS: Current Outpatient Medications on File Prior to Visit  Medication Sig Dispense Refill  . atenolol (TENORMIN) 25 MG tablet Take 25 mg by mouth daily.    . cholecalciferol (VITAMIN D) 1000 units tablet Take 1,000 Units by mouth daily.    . Cyanocobalamin  (VITAMIN B 12 PO) Take 1 capsule by mouth daily.    Marland Kitchen HYDROcodone-acetaminophen (NORCO/VICODIN) 5-325 MG tablet Take 1 tablet by mouth every 6 (six) hours as needed for moderate pain.    . Multiple Vitamin (MULTIVITAMIN WITH MINERALS) TABS tablet Take 1 tablet by mouth daily.    Marland Kitchen tiZANidine (ZANAFLEX) 4 MG capsule Take 4 mg by mouth 3 (three) times daily as needed for muscle spasms.    . Vitamin D, Ergocalciferol, (DRISDOL) 1.25 MG (50000 UT) CAPS capsule Take 1 capsule (50,000 Units total) by mouth every 7 (seven) days. 4 capsule 0   No current facility-administered medications on file prior to visit.     PAST MEDICAL HISTORY: Past Medical History:  Diagnosis Date  . Acid reflux   . Anemia   . Anxiety   . Chronic back pain   . Chronic SI joint pain   . Constipation   . Frequent urination   . Glaucoma   . HTN (hypertension)   . Infertility, female   . Leg edema   . No pertinent past medical history   . Right hip pain   . Right leg numbness  PAST SURGICAL HISTORY: Past Surgical History:  Procedure Laterality Date  . BILATERAL SALPINGECTOMY  01/08/2013   Procedure: BILATERAL SALPINGECTOMY;  Surgeon: Marvene Staff, MD;  Location: [facility];  Service: Gynecology;  Laterality: Bilateral;  . CATARACT EXTRACTION    . CESAREAN SECTION    . DIAGNOSTIC LAPAROSCOPY  1980's  . DILATION AND CURETTAGE OF UTERUS  2011  . ROBOTIC ASSISTED TOTAL HYSTERECTOMY  01/08/2013   Procedure: ROBOTIC ASSISTED TOTAL HYSTERECTOMY;  Surgeon: Marvene Staff, MD;  Location: [facility];  Service: Gynecology;  Laterality: N/A;  3 hrs.  . WISDOM TOOTH EXTRACTION      SOCIAL HISTORY: Social History   Tobacco Use  . Smoking status: Former Smoker    Packs/day: 2.50    Years: 20.00    Pack years: 50.00    Last attempt to quit: 01/01/1995    Years since quitting: 24.1  . Smokeless tobacco: Never Used  Substance Use Topics  . Alcohol use: Yes    Comment: occasional  . Drug use: No    FAMILY  HISTORY: Family History  Problem Relation Age of Onset  . Breast cancer Cousin   . Obesity Mother   . Cancer Father     ROS: Review of Systems  Constitutional: Negative for weight loss.  Endo/Heme/Allergies:       Positive for polyphagia.    PHYSICAL EXAM: Blood pressure (!) 177/90, pulse 70, height 5\' 2"  (1.575 m), weight 205 lb (93 kg), last menstrual period 12/19/2012, SpO2 100 %. Body mass index is 37.49 kg/m. Physical Exam Vitals signs reviewed.  Constitutional:      Appearance: Normal appearance. She is obese.  Cardiovascular:     Rate and Rhythm: Normal rate.  Pulmonary:     Effort: Pulmonary effort is normal.  Musculoskeletal: Normal range of motion.  Skin:    General: Skin is warm and dry.  Neurological:     Mental Status: She is alert and oriented to person, place, and time.  Psychiatric:        Mood and Affect: Mood normal.        Behavior: Behavior normal.     RECENT LABS AND TESTS: BMET    Component Value Date/Time   NA 142 11/03/2018 1025   K 4.0 11/03/2018 1025   CL 102 11/03/2018 1025   CO2 24 11/03/2018 1025   GLUCOSE 98 11/03/2018 1025   GLUCOSE 130 (H) 01/09/2013 0540   BUN 10 11/03/2018 1025   CREATININE 0.72 11/03/2018 1025   CALCIUM 9.6 11/03/2018 1025   GFRNONAA 95 11/03/2018 1025   GFRAA 109 11/03/2018 1025   Lab Results  Component Value Date   HGBA1C 6.2 (H) 11/03/2018   Lab Results  Component Value Date   INSULIN 20.5 11/03/2018   CBC    Component Value Date/Time   WBC 7.8 11/03/2018 1025   WBC 12.5 (H) 01/09/2013 0540   RBC 4.32 11/03/2018 1025   RBC 4.11 01/09/2013 0540   HGB 12.9 11/03/2018 1025   HCT 38.6 11/03/2018 1025   PLT 175 01/09/2013 0540   MCV 89 11/03/2018 1025   MCH 29.9 11/03/2018 1025   MCH 29.9 01/09/2013 0540   MCHC 33.4 11/03/2018 1025   MCHC 32.6 01/09/2013 0540   RDW 13.2 11/03/2018 1025   LYMPHSABS 1.8 11/03/2018 1025   EOSABS 0.1 11/03/2018 1025   BASOSABS 0.0 11/03/2018 1025    Iron/TIBC/Ferritin/ %Sat No results found for: IRON, TIBC, FERRITIN, IRONPCTSAT Lipid Panel     Component  Value Date/Time   CHOL 148 11/03/2018 1025   TRIG 75 11/03/2018 1025   HDL 72 11/03/2018 1025   LDLCALC 61 11/03/2018 1025   Hepatic Function Panel     Component Value Date/Time   PROT 6.9 11/03/2018 1025   ALBUMIN 4.3 11/03/2018 1025   AST 23 11/03/2018 1025   ALT 21 11/03/2018 1025   ALKPHOS 87 11/03/2018 1025   BILITOT 0.3 11/03/2018 1025      Component Value Date/Time   TSH 1.180 11/03/2018 1025   Results for AMIAYA, MCNEELEY (MRN [id number]) as of 02/23/2019 09:56  Ref. Range 11/03/2018 10:25  Vitamin D, 25-Hydroxy Latest Ref Range: 30.0 - 100.0 ng/mL 21.9 (L)    OBESITY BEHAVIORAL INTERVENTION VISIT  Today's visit was # 6   Starting weight: 202 lbs Starting date: 11/03/18 Today's weight : Weight: 205 lb (93 kg)  Today's date: 02/19/2019 Total lbs lost to date: 0   02/19/2019  Height 5\' 2"  (1.575 m)  Weight 205 lb (93 kg)  BMI (Calculated) 37.49  BLOOD PRESSURE - SYSTOLIC 703  BLOOD PRESSURE - DIASTOLIC 90   Body Fat % 50.0 %  Total Body Water (lbs) 82.2 lbs    ASK: We discussed the diagnosis of obesity with [name] today and [name] agreed to give [place] permission to discuss obesity behavioral modification therapy today.  ASSESS: Merril has the diagnosis of obesity and her BMI today is 37.49. Tena is in the action stage of change.   ADVISE: Tequisha was educated on the multiple health risks of obesity as well as the benefit of weight loss to improve her health. She was advised of the need for long term treatment and the importance of lifestyle modifications to improve her current health and to decrease her risk of future health problems.  AGREE: Multiple dietary modification options and treatment options were discussed and Hania agreed to follow the recommendations documented in the above note.  ARRANGE: Dominque was  educated on the importance of frequent visits to treat obesity as outlined per CMS and USPSTF guidelines and agreed to schedule her next follow up appointment today.  IMarcille Blanco, CMA, am acting as transcriptionist for Starlyn Skeans, MD  I have reviewed the above documentation for accuracy and completeness, and I agree with the above. -Dennard Nip, MD

## 2019-03-11 DIAGNOSIS — Z6838 Body mass index (BMI) 38.0-38.9, adult: Secondary | ICD-10-CM | POA: Diagnosis not present

## 2019-03-11 DIAGNOSIS — I10 Essential (primary) hypertension: Secondary | ICD-10-CM | POA: Diagnosis not present

## 2019-03-12 ENCOUNTER — Other Ambulatory Visit: Payer: Self-pay

## 2019-03-12 ENCOUNTER — Ambulatory Visit (INDEPENDENT_AMBULATORY_CARE_PROVIDER_SITE_OTHER): Payer: BLUE CROSS/BLUE SHIELD | Admitting: Family Medicine

## 2019-03-12 ENCOUNTER — Encounter (INDEPENDENT_AMBULATORY_CARE_PROVIDER_SITE_OTHER): Payer: Self-pay | Admitting: Family Medicine

## 2019-03-12 VITALS — BP 148/90 | HR 64 | Ht 62.0 in | Wt 202.0 lb

## 2019-03-12 DIAGNOSIS — Z9189 Other specified personal risk factors, not elsewhere classified: Secondary | ICD-10-CM

## 2019-03-12 DIAGNOSIS — F3289 Other specified depressive episodes: Secondary | ICD-10-CM | POA: Diagnosis not present

## 2019-03-12 DIAGNOSIS — I10 Essential (primary) hypertension: Secondary | ICD-10-CM

## 2019-03-12 DIAGNOSIS — Z6837 Body mass index (BMI) 37.0-37.9, adult: Secondary | ICD-10-CM

## 2019-03-12 MED ORDER — TOPIRAMATE 50 MG PO TABS: 50.0000 mg | ORAL_TABLET | Freq: Every day | ORAL | 0 refills | Status: AC

## 2019-03-12 NOTE — Progress Notes (Signed)
Office: [phone]  /  Fax: [phone]   HPI:   Chief Complaint: OBESITY Tonya Clarke is here to discuss her progress with her obesity treatment plan. She is keeping a food journal with 1200 calories and 70+ grams of protein and is following her eating plan approximately 90 % of the time. She states she is walking 30 minutes 7 times per week. Tonya Clarke continues to do well with losing weight. She is journaling most days and meeting her protein and vegetable goals.  Her weight is 202 lb (91.6 kg) today and has had a weight loss of 3 pounds over a period of 3 weeks since her last visit. She has lost 0 lbs since starting treatment with [place].  Hypertension Tonya Clarke is a 58 y.o. female with hypertension. Tonya Clarke is on atenolol 25mg . She states that she has white coat syndrome and her blood pressure runs 518 to 841'Y systolic at home. She is working on weight loss to help control her blood pressure with the goal of decreasing her risk of heart attack and stroke.  At risk for cardiovascular disease Tonya Clarke is at a higher than average risk for cardiovascular disease due to hypertension and obesity. She currently denies any chest pain.  Depression with emotional eating behaviors Tonya Clarke notes increased emotional and stress eating and using food for comfort to the extent that it is negatively impacting her health. She often snacks when she is not hungry. Tonya Clarke sometimes feels she is out of control and then feels guilty that she made poor food choices. She has been working on behavior modification techniques to help reduce her emotional eating and has been somewhat successful.   ASSESSMENT AND PLAN:  Essential hypertension  Other depression - with emotional eating  - Plan: topiramate (TOPAMAX) 50 MG tablet  At risk for heart disease  Class 2 severe obesity with serious comorbidity and body mass index (BMI) of 37.0 to 37.9 in adult, unspecified obesity type ([place])  PLAN:   Hypertension We discussed sodium restriction, working on healthy weight loss, and a regular exercise program as the means to achieve improved blood pressure control. We will continue to monitor her blood pressure as well as her progress with the above lifestyle modifications. She will continue her medications as prescribed and will watch for signs of hypotension as she continues her lifestyle modifications. She will bring in her blood pressure log to her next visit. We may need to add another blood pressure medicine if there is no improvement. Tonya Clarke agreed with this plan and agreed to follow up as directed in 3 weeks.  Cardiovascular risk counseling Tonya Clarke was given extended (15 minutes) coronary artery disease prevention counseling today. She is 58 y.o. female and has risk factors for heart disease including hypertension and obesity. We discussed intensive lifestyle modifications today with an emphasis on specific weight loss instructions and strategies. Pt was also informed of the importance of increasing exercise and decreasing saturated fats to help prevent heart disease.  Depression with Emotional Eating Behaviors We discussed behavior modification techniques today to help Tonya Clarke deal with her emotional eating and depression. She has agreed to start Topamax 50 mg qhs #30 with no refills and agreed to follow up as directed.  Obesity Tonya Clarke is currently in the action stage of change. As such, her goal is to continue with weight loss efforts. She has agreed to keep a food journal with 1200 calories and 70+ grams of protein.  Tonya Clarke has been instructed to work up to a  goal of 150 minutes of combined cardio and strengthening exercise per week for weight loss and overall health benefits. We discussed the following Behavioral Modification Strategies today: increasing lean protein intake, decreasing simple carbohydrates, keeping a strict food journal, and work on meal planning and easy  cooking plans.  Tonya Clarke has agreed to follow up with our clinic in 3 weeks. She was informed of the importance of frequent follow up visits to maximize her success with intensive lifestyle modifications for her multiple health conditions.  ALLERGIES: Allergies  Allergen Reactions  . Aspirin Anaphylaxis  . Penicillins Anaphylaxis  . Amlodipine Itching    Makes skin crawl  . Hydrochlorothiazide Other (See Comments)    "makes skin burn"  . Lisinopril Other (See Comments)    Makes sinuses drain   . Red Dye     "makes my skin crawl"  . Sulfa Antibiotics Other (See Comments)    "makes my skin burn"  . Valtrex [Valacyclovir] Other (See Comments)    Joints swell  . Losartan Rash    MEDICATIONS: Current Outpatient Medications on File Prior to Visit  Medication Sig Dispense Refill  . atenolol (TENORMIN) 25 MG tablet Take 25 mg by mouth daily.    . Cyanocobalamin (VITAMIN B 12 PO) Take 1 capsule by mouth daily.    Marland Kitchen HYDROcodone-acetaminophen (NORCO/VICODIN) 5-325 MG tablet Take 1 tablet by mouth every 6 (six) hours as needed for moderate pain.    . Multiple Vitamin (MULTIVITAMIN WITH MINERALS) TABS tablet Take 1 tablet by mouth daily.    Marland Kitchen tiZANidine (ZANAFLEX) 4 MG capsule Take 4 mg by mouth 3 (three) times daily as needed for muscle spasms.    . Vitamin D, Ergocalciferol, (DRISDOL) 1.25 MG (50000 UT) CAPS capsule Take 1 capsule (50,000 Units total) by mouth every 7 (seven) days. 4 capsule 0   No current facility-administered medications on file prior to visit.     PAST MEDICAL HISTORY: Past Medical History:  Diagnosis Date  . Acid reflux   . Anemia   . Anxiety   . Chronic back pain   . Chronic SI joint pain   . Constipation   . Frequent urination   . Glaucoma   . HTN (hypertension)   . Infertility, female   . Leg edema   . No pertinent past medical history   . Right hip pain   . Right leg numbness     PAST SURGICAL HISTORY: Past Surgical History:  Procedure  Laterality Date  . BILATERAL SALPINGECTOMY  01/08/2013   Procedure: BILATERAL SALPINGECTOMY;  Surgeon: Marvene Staff, MD;  Location: [facility];  Service: Gynecology;  Laterality: Bilateral;  . CATARACT EXTRACTION    . CESAREAN SECTION    . DIAGNOSTIC LAPAROSCOPY  1980's  . DILATION AND CURETTAGE OF UTERUS  2011  . ROBOTIC ASSISTED TOTAL HYSTERECTOMY  01/08/2013   Procedure: ROBOTIC ASSISTED TOTAL HYSTERECTOMY;  Surgeon: Marvene Staff, MD;  Location: [facility];  Service: Gynecology;  Laterality: N/A;  3 hrs.  . WISDOM TOOTH EXTRACTION      SOCIAL HISTORY: Social History   Tobacco Use  . Smoking status: Former Smoker    Packs/day: 2.50    Years: 20.00    Pack years: 50.00    Last attempt to quit: 01/01/1995    Years since quitting: 24.2  . Smokeless tobacco: Never Used  Substance Use Topics  . Alcohol use: Yes    Comment: occasional  . Drug use: No    FAMILY HISTORY: Family  History  Problem Relation Age of Onset  . Breast cancer Cousin   . Obesity Mother   . Cancer Father    ROS: Review of Systems  Constitutional: Positive for weight loss.   PHYSICAL EXAM: Blood pressure (!) 148/90, pulse 64, height 5\' 2"  (1.575 m), weight 202 lb (91.6 kg), last menstrual period 12/19/2012, SpO2 100 %. Body mass index is 36.95 kg/m. Physical Exam Vitals signs reviewed.  Constitutional:      Appearance: Normal appearance. She is obese.  Cardiovascular:     Rate and Rhythm: Normal rate.  Pulmonary:     Effort: Pulmonary effort is normal.  Musculoskeletal: Normal range of motion.  Skin:    General: Skin is warm and dry.  Neurological:     Mental Status: She is alert and oriented to person, place, and time.  Psychiatric:        Mood and Affect: Mood normal.        Behavior: Behavior normal.    RECENT LABS AND TESTS: BMET    Component Value Date/Time   NA 142 11/03/2018 1025   K 4.0 11/03/2018 1025   CL 102 11/03/2018 1025   CO2 24 11/03/2018 1025   GLUCOSE 98  11/03/2018 1025   GLUCOSE 130 (H) 01/09/2013 0540   BUN 10 11/03/2018 1025   CREATININE 0.72 11/03/2018 1025   CALCIUM 9.6 11/03/2018 1025   GFRNONAA 95 11/03/2018 1025   GFRAA 109 11/03/2018 1025   Lab Results  Component Value Date   HGBA1C 6.2 (H) 11/03/2018   Lab Results  Component Value Date   INSULIN 20.5 11/03/2018   CBC    Component Value Date/Time   WBC 7.8 11/03/2018 1025   WBC 12.5 (H) 01/09/2013 0540   RBC 4.32 11/03/2018 1025   RBC 4.11 01/09/2013 0540   HGB 12.9 11/03/2018 1025   HCT 38.6 11/03/2018 1025   PLT 175 01/09/2013 0540   MCV 89 11/03/2018 1025   MCH 29.9 11/03/2018 1025   MCH 29.9 01/09/2013 0540   MCHC 33.4 11/03/2018 1025   MCHC 32.6 01/09/2013 0540   RDW 13.2 11/03/2018 1025   LYMPHSABS 1.8 11/03/2018 1025   EOSABS 0.1 11/03/2018 1025   BASOSABS 0.0 11/03/2018 1025   Iron/TIBC/Ferritin/ %Sat No results found for: IRON, TIBC, FERRITIN, IRONPCTSAT Lipid Panel     Component Value Date/Time   CHOL 148 11/03/2018 1025   TRIG 75 11/03/2018 1025   HDL 72 11/03/2018 1025   LDLCALC 61 11/03/2018 1025   Hepatic Function Panel     Component Value Date/Time   PROT 6.9 11/03/2018 1025   ALBUMIN 4.3 11/03/2018 1025   AST 23 11/03/2018 1025   ALT 21 11/03/2018 1025   ALKPHOS 87 11/03/2018 1025   BILITOT 0.3 11/03/2018 1025      Component Value Date/Time   TSH 1.180 11/03/2018 1025   Results for JORDEN, MAHL (MRN [id number]) as of 03/12/2019 14:37  Ref. Range 11/03/2018 10:25  Vitamin D, 25-Hydroxy Latest Ref Range: 30.0 - 100.0 ng/mL 21.9 (L)   OBESITY BEHAVIORAL INTERVENTION VISIT  Today's visit was # 7   Starting weight: 202 lbs Starting date: 11/03/18 Today's weight : Weight: 202 lb (91.6 kg)  Today's date: 03/12/2019 Total lbs lost to date: 0    03/12/2019  Height 5\' 2"  (1.575 m)  Weight 202 lb (91.6 kg)  BMI (Calculated) 36.94  BLOOD PRESSURE - SYSTOLIC 643  BLOOD PRESSURE - DIASTOLIC 90   Body Fat % 45 %  Total  Body  Water (lbs) 80 lbs   ASK: We discussed the diagnosis of obesity with Tonya Clarke today and Tonya Clarke agreed to give [place] permission to discuss obesity behavioral modification therapy today.  ASSESS: Tonya Clarke has the diagnosis of obesity and her BMI today is 36.94. Tonya Clarke is in the action stage of change.   ADVISE: Tonya Clarke was educated on the multiple health risks of obesity as well as the benefit of weight loss to improve her health. She was advised of the need for long term treatment and the importance of lifestyle modifications to improve her current health and to decrease her risk of future health problems.  AGREE: Multiple dietary modification options and treatment options were discussed and Tonya Clarke agreed to follow the recommendations documented in the above note.  ARRANGE: Tonya Clarke was educated on the importance of frequent visits to treat obesity as outlined per CMS and USPSTF guidelines and agreed to schedule her next follow up appointment today.  IMarcille Clarke, CMA, am acting as transcriptionist for Starlyn Skeans, MD  I have reviewed the above documentation for accuracy and completeness, and I agree with the above. -Dennard Nip, MD

## 2019-03-25 ENCOUNTER — Encounter (INDEPENDENT_AMBULATORY_CARE_PROVIDER_SITE_OTHER): Payer: Self-pay

## 2019-03-26 ENCOUNTER — Other Ambulatory Visit: Payer: Self-pay | Admitting: Obstetrics and Gynecology

## 2019-03-26 DIAGNOSIS — Z1231 Encounter for screening mammogram for malignant neoplasm of breast: Secondary | ICD-10-CM

## 2019-03-28 ENCOUNTER — Other Ambulatory Visit (INDEPENDENT_AMBULATORY_CARE_PROVIDER_SITE_OTHER): Payer: Self-pay | Admitting: Family Medicine

## 2019-03-28 DIAGNOSIS — E559 Vitamin D deficiency, unspecified: Secondary | ICD-10-CM

## 2019-04-02 ENCOUNTER — Other Ambulatory Visit: Payer: Self-pay

## 2019-04-02 ENCOUNTER — Ambulatory Visit (INDEPENDENT_AMBULATORY_CARE_PROVIDER_SITE_OTHER): Payer: BLUE CROSS/BLUE SHIELD | Admitting: Family Medicine

## 2019-04-02 ENCOUNTER — Encounter (INDEPENDENT_AMBULATORY_CARE_PROVIDER_SITE_OTHER): Payer: Self-pay | Admitting: Family Medicine

## 2019-04-02 DIAGNOSIS — E559 Vitamin D deficiency, unspecified: Secondary | ICD-10-CM | POA: Diagnosis not present

## 2019-04-02 DIAGNOSIS — R51 Headache: Secondary | ICD-10-CM

## 2019-04-02 DIAGNOSIS — R519 Headache, unspecified: Secondary | ICD-10-CM

## 2019-04-02 DIAGNOSIS — Z6836 Body mass index (BMI) 36.0-36.9, adult: Secondary | ICD-10-CM

## 2019-04-02 DIAGNOSIS — M461 Sacroiliitis, not elsewhere classified: Secondary | ICD-10-CM | POA: Diagnosis not present

## 2019-04-02 DIAGNOSIS — M792 Neuralgia and neuritis, unspecified: Secondary | ICD-10-CM | POA: Diagnosis not present

## 2019-04-02 DIAGNOSIS — M47816 Spondylosis without myelopathy or radiculopathy, lumbar region: Secondary | ICD-10-CM | POA: Diagnosis not present

## 2019-04-02 MED ORDER — VITAMIN D (ERGOCALCIFEROL) 1.25 MG (50000 UNIT) PO CAPS: 50000.0000 [IU] | ORAL_CAPSULE | ORAL | 0 refills | Status: DC

## 2019-04-02 NOTE — Progress Notes (Signed)
Office: [phone]  /  Fax: [phone] TeleHealth Visit:  Tonya Clarke has verbally consented to this TeleHealth visit today. The patient is located at home, the provider is located at the [facility] office. The participants in this visit include the listed provider and patient. The visit was conducted today via Face Time.  HPI:   Chief Complaint: OBESITY Tonya Clarke is here to discuss her progress with her obesity treatment plan. She is keeping a food journal with 1200 calories and 70 grams of protein and is following her eating plan approximately 10 % of the time. She states she is walking 30 minutes 7 times per week. Tonya Clarke is trying to keep a food journal and feels that she is doing well. She thinks that she has lost about 3 pounds since our last visit. She has increased vegetables, but is not eating even half of her protein goals.  We were unable to weigh the patient today for this TeleHealth visit. She feels as if she has lost weight since her last visit. She has lost 0 lbs since starting treatment with [place].  Vitamin D Deficiency Tonya Clarke has a diagnosis of vitamin D deficiency. She is currently stable on vit D, but is not yet at goal. Tonya Clarke denies nausea, vomiting, or muscle weakness.  Headache Tonya Clarke started taking topiramate 25 mg at bedtime. She thinks it may be causing a sharp occasional frontal headache. She notes that she is outside more and her allergies have worsened.  ASSESSMENT AND PLAN:  Vitamin D deficiency - Plan: Vitamin D, Ergocalciferol, (DRISDOL) 1.25 MG (50000 UT) CAPS capsule  Nonintractable headache, unspecified chronicity pattern, unspecified headache type  Class 2 severe obesity with serious comorbidity and body mass index (BMI) of 36.0 to 36.9 in adult, unspecified obesity type ([place])  PLAN:  Vitamin D Deficiency Tonya Clarke was informed that low vitamin D levels contribute to fatigue and are associated with obesity, breast,  and colon cancer. Tonya Clarke agrees to continue to take prescription Vit D @50 ,000 IU every week #4 with no refills and will follow up for routine testing of vitamin D, at least 2-3 times per year. She was informed of the risk of over-replacement of vitamin D and agrees to not increase her dose unless she discusses this with [place] first. Tonya Clarke agrees to follow up in 2 weeks as directed.  Headache It is doubtful that the topiramate is causing Tonya Clarke's headache, but more likely due to sinus pressure. She was advised to start OTC Flonase and see if this helps. Tonya Clarke agrees to follow up as directed to monitor her progress.  Obesity Tonya Clarke is currently in the action stage of change. As such, her goal is to continue with weight loss efforts. She has agreed to keep a food journal with 1200 calories and 70+ protein. Tonya Clarke has been instructed to continue walking 7 days a week. We discussed the following Behavioral Modification Strategies today: increasing lean protein intake, keeping healthy foods in the home, and work on meal planning and easy cooking plans.  Tonya Clarke has agreed to follow up with our clinic in 2 weeks. She was informed of the importance of frequent follow up visits to maximize her success with intensive lifestyle modifications for her multiple health conditions.  ALLERGIES: Allergies  Allergen Reactions  . Aspirin Anaphylaxis  . Penicillins Anaphylaxis  . Amlodipine Itching    Makes skin crawl  . Hydrochlorothiazide Other (See Comments)    "makes skin burn"  . Lisinopril Other (See Comments)  Makes sinuses drain   . Red Dye     "makes my skin crawl"  . Sulfa Antibiotics Other (See Comments)    "makes my skin burn"  . Valtrex [Valacyclovir] Other (See Comments)    Joints swell  . Losartan Rash    MEDICATIONS: Current Outpatient Medications on File Prior to Visit  Medication Sig Dispense Refill  . atenolol (TENORMIN) 25 MG tablet Take 25 mg by mouth  daily.    . Cyanocobalamin (VITAMIN B 12 PO) Take 1 capsule by mouth daily.    Marland Kitchen HYDROcodone-acetaminophen (NORCO/VICODIN) 5-325 MG tablet Take 1 tablet by mouth every 6 (six) hours as needed for moderate pain.    . Multiple Vitamin (MULTIVITAMIN WITH MINERALS) TABS tablet Take 1 tablet by mouth daily.    Marland Kitchen tiZANidine (ZANAFLEX) 4 MG capsule Take 4 mg by mouth 3 (three) times daily as needed for muscle spasms.    Marland Kitchen topiramate (TOPAMAX) 50 MG tablet Take 1 tablet (50 mg total) by mouth at bedtime. 30 tablet 0  . Vitamin D, Ergocalciferol, (DRISDOL) 1.25 MG (50000 UT) CAPS capsule Take 1 capsule (50,000 Units total) by mouth every 7 (seven) days. 4 capsule 0   No current facility-administered medications on file prior to visit.     PAST MEDICAL HISTORY: Past Medical History:  Diagnosis Date  . Acid reflux   . Anemia   . Anxiety   . Chronic back pain   . Chronic SI joint pain   . Constipation   . Frequent urination   . Glaucoma   . HTN (hypertension)   . Infertility, female   . Leg edema   . No pertinent past medical history   . Right hip pain   . Right leg numbness     PAST SURGICAL HISTORY: Past Surgical History:  Procedure Laterality Date  . BILATERAL SALPINGECTOMY  01/08/2013   Procedure: BILATERAL SALPINGECTOMY;  Surgeon: Marvene Staff, MD;  Location: [facility];  Service: Gynecology;  Laterality: Bilateral;  . CATARACT EXTRACTION    . CESAREAN SECTION    . DIAGNOSTIC LAPAROSCOPY  1980's  . DILATION AND CURETTAGE OF UTERUS  2011  . ROBOTIC ASSISTED TOTAL HYSTERECTOMY  01/08/2013   Procedure: ROBOTIC ASSISTED TOTAL HYSTERECTOMY;  Surgeon: Marvene Staff, MD;  Location: [facility];  Service: Gynecology;  Laterality: N/A;  3 hrs.  . WISDOM TOOTH EXTRACTION      SOCIAL HISTORY: Social History   Tobacco Use  . Smoking status: Former Smoker    Packs/day: 2.50    Years: 20.00    Pack years: 50.00    Last attempt to quit: 01/01/1995    Years since quitting: 24.2  .  Smokeless tobacco: Never Used  Substance Use Topics  . Alcohol use: Yes    Comment: occasional  . Drug use: No    FAMILY HISTORY: Family History  Problem Relation Age of Onset  . Breast cancer Cousin   . Obesity Mother   . Cancer Father     ROS: Review of Systems  Gastrointestinal: Negative for nausea and vomiting.  Musculoskeletal:       Negative for muscle weakness.  Neurological: Positive for headaches.    PHYSICAL EXAM: Pt in no acute distress  RECENT LABS AND TESTS: BMET    Component Value Date/Time   NA 142 11/03/2018 1025   K 4.0 11/03/2018 1025   CL 102 11/03/2018 1025   CO2 24 11/03/2018 1025   GLUCOSE 98 11/03/2018 1025   GLUCOSE 130 (  H) 01/09/2013 0540   BUN 10 11/03/2018 1025   CREATININE 0.72 11/03/2018 1025   CALCIUM 9.6 11/03/2018 1025   GFRNONAA 95 11/03/2018 1025   GFRAA 109 11/03/2018 1025   Lab Results  Component Value Date   HGBA1C 6.2 (H) 11/03/2018   Lab Results  Component Value Date   INSULIN 20.5 11/03/2018   CBC    Component Value Date/Time   WBC 7.8 11/03/2018 1025   WBC 12.5 (H) 01/09/2013 0540   RBC 4.32 11/03/2018 1025   RBC 4.11 01/09/2013 0540   HGB 12.9 11/03/2018 1025   HCT 38.6 11/03/2018 1025   PLT 175 01/09/2013 0540   MCV 89 11/03/2018 1025   MCH 29.9 11/03/2018 1025   MCH 29.9 01/09/2013 0540   MCHC 33.4 11/03/2018 1025   MCHC 32.6 01/09/2013 0540   RDW 13.2 11/03/2018 1025   LYMPHSABS 1.8 11/03/2018 1025   EOSABS 0.1 11/03/2018 1025   BASOSABS 0.0 11/03/2018 1025   Iron/TIBC/Ferritin/ %Sat No results found for: IRON, TIBC, FERRITIN, IRONPCTSAT Lipid Panel     Component Value Date/Time   CHOL 148 11/03/2018 1025   TRIG 75 11/03/2018 1025   HDL 72 11/03/2018 1025   LDLCALC 61 11/03/2018 1025   Hepatic Function Panel     Component Value Date/Time   PROT 6.9 11/03/2018 1025   ALBUMIN 4.3 11/03/2018 1025   AST 23 11/03/2018 1025   ALT 21 11/03/2018 1025   ALKPHOS 87 11/03/2018 1025   BILITOT 0.3  11/03/2018 1025      Component Value Date/Time   TSH 1.180 11/03/2018 1025   Results for TRENITA, HULME (MRN [id number]) as of 04/02/2019 09:34  Ref. Range 11/03/2018 10:25  Vitamin D, 25-Hydroxy Latest Ref Range: 30.0 - 100.0 ng/mL 21.9 (L)    I, Marcille Blanco, CMA, am acting as transcriptionist for Starlyn Skeans, MD  I have reviewed the above documentation for accuracy and completeness, and I agree with the above. -Dennard Nip, MD

## 2019-04-08 DIAGNOSIS — M47816 Spondylosis without myelopathy or radiculopathy, lumbar region: Secondary | ICD-10-CM | POA: Diagnosis not present

## 2019-04-16 ENCOUNTER — Other Ambulatory Visit: Payer: Self-pay

## 2019-04-16 ENCOUNTER — Encounter (INDEPENDENT_AMBULATORY_CARE_PROVIDER_SITE_OTHER): Payer: Self-pay | Admitting: Family Medicine

## 2019-04-16 ENCOUNTER — Ambulatory Visit (INDEPENDENT_AMBULATORY_CARE_PROVIDER_SITE_OTHER): Payer: BLUE CROSS/BLUE SHIELD | Admitting: Family Medicine

## 2019-04-16 DIAGNOSIS — I10 Essential (primary) hypertension: Secondary | ICD-10-CM

## 2019-04-16 DIAGNOSIS — Z6836 Body mass index (BMI) 36.0-36.9, adult: Secondary | ICD-10-CM | POA: Diagnosis not present

## 2019-04-16 NOTE — Progress Notes (Signed)
Office: [phone]  /  Fax: [phone] TeleHealth Visit:  Tonya Clarke has verbally consented to this TeleHealth visit today. The patient is located at home, the provider is located at the [facility] office. The participants in this visit include the listed provider and patient. Tonya Clarke was unable to use realtime audiovisual technology today and the Telehealth visit was conducted via telephone.  HPI:   Chief Complaint: OBESITY Tonya Clarke is here to discuss her progress with her obesity treatment plan. She is keeping a food journal with 1200 calories and 70 grams of protein and is following her eating plan approximately 85 % of the time. She states she is walking 5,000 steps 30 to 60 minutes 5 times per week. Tonya Clarke feels that she is doing well on her diet prescription and with weight loss efforts. She hasn't weighed, but her clothes are looser. She is doing well with isolation and keeping herself busy and occupied.  We were unable to weigh the patient today for this TeleHealth visit. She feels as if she may have lost weight since her last visit. She has lost 0 lbs since starting treatment with [place].  Hypertension Tonya Clarke is a 58 y.o. female with hypertension. Tonya Clarke's blood pressure was 142/90 yesterday and this is better than our last few blood pressure readings in the office. She is working on diet and weight loss to help control her blood pressure with the goal of decreasing her risk of heart attack and stroke. Tonya Clarke denies chest pain, headache, or lightheadedness.  ASSESSMENT AND PLAN:  Essential hypertension  Class 2 severe obesity with serious comorbidity and body mass index (BMI) of 36.0 to 36.9 in adult, unspecified obesity type ([place])  PLAN:  Hypertension We discussed sodium restriction, working on healthy weight loss, and a regular exercise program as the means to achieve improved blood pressure control. We will continue to monitor her  blood pressure as well as her progress with the above lifestyle modifications. She will continue her diet, exercise, and medications as prescribed. She was educated on the proper way to take her blood pressure at home. Tonya Clarke will watch for signs of hypotension as she continues her lifestyle modifications. Tonya Clarke agreed with this plan and agreed to follow up as directed in 2 to 3 weeks.  I spent > than 50% of the 15 minute visit on counseling as documented in the note.  Obesity Tonya Clarke is currently in the action stage of change. As such, her goal is to continue with weight loss efforts. She has agreed to keep a food journal with 1200 calories and 70+ grams of protein.  Tonya Clarke has been instructed to work up to a goal of 150 minutes of combined cardio and strengthening exercise per week for weight loss and overall health benefits. We discussed the following Behavioral Modification Strategies today: increase water intake, decreasing sodium intake, keeping healthy foods in the home, and work on meal planning and easy cooking plans.  Tonya Clarke has agreed to follow up with our clinic in 2 to 3 weeks. She was informed of the importance of frequent follow up visits to maximize her success with intensive lifestyle modifications for her multiple health conditions.  ALLERGIES: Allergies  Allergen Reactions  . Aspirin Anaphylaxis  . Penicillins Anaphylaxis  . Amlodipine Itching    Makes skin crawl  . Hydrochlorothiazide Other (See Comments)    "makes skin burn"  . Lisinopril Other (See Comments)    Makes sinuses drain   . Red Dye     "  makes my skin crawl"  . Sulfa Antibiotics Other (See Comments)    "makes my skin burn"  . Valtrex [Valacyclovir] Other (See Comments)    Joints swell  . Losartan Rash    MEDICATIONS: Current Outpatient Medications on File Prior to Visit  Medication Sig Dispense Refill  . atenolol (TENORMIN) 25 MG tablet Take 25 mg by mouth daily.    . Cyanocobalamin  (VITAMIN B 12 PO) Take 1 capsule by mouth daily.    Marland Kitchen HYDROcodone-acetaminophen (NORCO/VICODIN) 5-325 MG tablet Take 1 tablet by mouth every 6 (six) hours as needed for moderate pain.    . Multiple Vitamin (MULTIVITAMIN WITH MINERALS) TABS tablet Take 1 tablet by mouth daily.    Marland Kitchen tiZANidine (ZANAFLEX) 4 MG capsule Take 4 mg by mouth 3 (three) times daily as needed for muscle spasms.    Marland Kitchen topiramate (TOPAMAX) 50 MG tablet Take 1 tablet (50 mg total) by mouth at bedtime. 30 tablet 0  . Vitamin D, Ergocalciferol, (DRISDOL) 1.25 MG (50000 UT) CAPS capsule Take 1 capsule (50,000 Units total) by mouth every 7 (seven) days. 4 capsule 0   No current facility-administered medications on file prior to visit.     PAST MEDICAL HISTORY: Past Medical History:  Diagnosis Date  . Acid reflux   . Anemia   . Anxiety   . Chronic back pain   . Chronic SI joint pain   . Constipation   . Frequent urination   . Glaucoma   . HTN (hypertension)   . Infertility, female   . Leg edema   . No pertinent past medical history   . Right hip pain   . Right leg numbness     PAST SURGICAL HISTORY: Past Surgical History:  Procedure Laterality Date  . BILATERAL SALPINGECTOMY  01/08/2013   Procedure: BILATERAL SALPINGECTOMY;  Surgeon: Marvene Staff, MD;  Location: [facility];  Service: Gynecology;  Laterality: Bilateral;  . CATARACT EXTRACTION    . CESAREAN SECTION    . DIAGNOSTIC LAPAROSCOPY  1980's  . DILATION AND CURETTAGE OF UTERUS  2011  . ROBOTIC ASSISTED TOTAL HYSTERECTOMY  01/08/2013   Procedure: ROBOTIC ASSISTED TOTAL HYSTERECTOMY;  Surgeon: Marvene Staff, MD;  Location: [facility];  Service: Gynecology;  Laterality: N/A;  3 hrs.  . WISDOM TOOTH EXTRACTION      SOCIAL HISTORY: Social History   Tobacco Use  . Smoking status: Former Smoker    Packs/day: 2.50    Years: 20.00    Pack years: 50.00    Last attempt to quit: 01/01/1995    Years since quitting: 24.3  . Smokeless tobacco: Never Used   Substance Use Topics  . Alcohol use: Yes    Comment: occasional  . Drug use: No    FAMILY HISTORY: Family History  Problem Relation Age of Onset  . Breast cancer Cousin   . Obesity Mother   . Cancer Father     ROS: Review of Systems  Cardiovascular: Negative for chest pain.  Neurological: Negative for headaches.       Negative for lightheadedness.    PHYSICAL EXAM: Pt in no acute distress  RECENT LABS AND TESTS: BMET    Component Value Date/Time   NA 142 11/03/2018 1025   K 4.0 11/03/2018 1025   CL 102 11/03/2018 1025   CO2 24 11/03/2018 1025   GLUCOSE 98 11/03/2018 1025   GLUCOSE 130 (H) 01/09/2013 0540   BUN 10 11/03/2018 1025   CREATININE 0.72 11/03/2018 1025  CALCIUM 9.6 11/03/2018 1025   GFRNONAA 95 11/03/2018 1025   GFRAA 109 11/03/2018 1025   Lab Results  Component Value Date   HGBA1C 6.2 (H) 11/03/2018   Lab Results  Component Value Date   INSULIN 20.5 11/03/2018   CBC    Component Value Date/Time   WBC 7.8 11/03/2018 1025   WBC 12.5 (H) 01/09/2013 0540   RBC 4.32 11/03/2018 1025   RBC 4.11 01/09/2013 0540   HGB 12.9 11/03/2018 1025   HCT 38.6 11/03/2018 1025   PLT 175 01/09/2013 0540   MCV 89 11/03/2018 1025   MCH 29.9 11/03/2018 1025   MCH 29.9 01/09/2013 0540   MCHC 33.4 11/03/2018 1025   MCHC 32.6 01/09/2013 0540   RDW 13.2 11/03/2018 1025   LYMPHSABS 1.8 11/03/2018 1025   EOSABS 0.1 11/03/2018 1025   BASOSABS 0.0 11/03/2018 1025   Iron/TIBC/Ferritin/ %Sat No results found for: IRON, TIBC, FERRITIN, IRONPCTSAT Lipid Panel     Component Value Date/Time   CHOL 148 11/03/2018 1025   TRIG 75 11/03/2018 1025   HDL 72 11/03/2018 1025   LDLCALC 61 11/03/2018 1025   Hepatic Function Panel     Component Value Date/Time   PROT 6.9 11/03/2018 1025   ALBUMIN 4.3 11/03/2018 1025   AST 23 11/03/2018 1025   ALT 21 11/03/2018 1025   ALKPHOS 87 11/03/2018 1025   BILITOT 0.3 11/03/2018 1025      Component Value Date/Time   TSH  1.180 11/03/2018 1025    Results for CARLITA, WHITCOMB (MRN [id number]) as of 04/16/2019 11:02  Ref. Range 11/03/2018 10:25  Vitamin D, 25-Hydroxy Latest Ref Range: 30.0 - 100.0 ng/mL 21.9 (L)    I, Marcille Blanco, CMA, am acting as transcriptionist for Starlyn Skeans, MD I have reviewed the above documentation for accuracy and completeness, and I agree with the above. -Dennard Nip, MD

## 2019-05-07 ENCOUNTER — Other Ambulatory Visit: Payer: Self-pay

## 2019-05-07 ENCOUNTER — Ambulatory Visit (INDEPENDENT_AMBULATORY_CARE_PROVIDER_SITE_OTHER): Payer: BLUE CROSS/BLUE SHIELD | Admitting: Family Medicine

## 2019-05-07 DIAGNOSIS — Z6836 Body mass index (BMI) 36.0-36.9, adult: Secondary | ICD-10-CM | POA: Diagnosis not present

## 2019-05-07 DIAGNOSIS — R51 Headache: Secondary | ICD-10-CM | POA: Diagnosis not present

## 2019-05-07 DIAGNOSIS — E559 Vitamin D deficiency, unspecified: Secondary | ICD-10-CM

## 2019-05-07 DIAGNOSIS — R519 Headache, unspecified: Secondary | ICD-10-CM

## 2019-05-07 MED ORDER — VITAMIN D (ERGOCALCIFEROL) 1.25 MG (50000 UNIT) PO CAPS: 50000.0000 [IU] | ORAL_CAPSULE | ORAL | 0 refills | Status: DC

## 2019-05-07 NOTE — Progress Notes (Signed)
Office: [phone]  /  Fax: [phone] TeleHealth Visit:  Tonya Clarke has verbally consented to this TeleHealth visit today. The patient is located at home, the provider is located at the [facility] office. The participants in this visit include the listed provider and patient. The visit was conducted today via Face Time.  HPI:   Chief Complaint: OBESITY Tonya Clarke is here to discuss her progress with her obesity treatment plan. She is keeping a food journal with 1200 calories and 70+ grams of protein and is following her eating plan approximately 85 % of the time. She states she is walking 30 minutes 6 times per week. Tonya Clarke states that she is doing well with her journaling and feels that she is still losing weight. She is ok staying at home and not feeling too stressed. Her hunger is controlled and she is making good food choices.  We were unable to weigh the patient today for this TeleHealth visit. She feels as if she has lost weight since her last visit. She has lost 0 lbs since starting treatment with [place].  Vitamin D Deficiency Tonya Clarke has a diagnosis of vitamin D deficiency. She is currently stable on vit D, but is not yet at goal. Angelis denies nausea, vomiting, or muscle weakness.  Headache Tonya Clarke stopped her topiramate as she felt it was causing throbbing forehead pain. She has not had any further problems with headaches.  ASSESSMENT AND PLAN:  Vitamin D deficiency - Plan: Vitamin D, Ergocalciferol, (DRISDOL) 1.25 MG (50000 UT) CAPS capsule  Nonintractable episodic headache, unspecified headache type  Class 2 severe obesity with serious comorbidity and body mass index (BMI) of 36.0 to 36.9 in adult, unspecified obesity type ([place])  PLAN:  Vitamin D Deficiency Tonya Clarke was informed that low vitamin D levels contribute to fatigue and are associated with obesity, breast, and colon cancer. Tonya Clarke agrees to continue to take prescription Vit D  @50 ,000 IU every week #4 with no refills and will follow up for routine testing of vitamin D, at least 2-3 times per year. She was informed of the risk of over-replacement of vitamin D and agrees to not increase her dose unless she discusses this with [place] first. Tonya Clarke agrees to follow up in 2 weeks as directed.  Headache Tonya Clarke is okay to discontinue topiramate and has agreed to follow up in 2 weeks to monitor her progress.  Obesity Tonya Clarke is currently in the action stage of change. As such, her goal is to continue with weight loss efforts. She has agreed to keep a food journal with 1200 calories and 70+ grams of protein.  Tonya Clarke has been instructed to work up to a goal of 150 minutes of combined cardio and strengthening exercise per week for weight loss and overall health benefits. We discussed the following Behavioral Modification Strategies today: decrease eating out, keeping healthy foods in the home, and better snacking choices.  Tonya Clarke has agreed to follow up with our clinic in 2 weeks. She was informed of the importance of frequent follow up visits to maximize her success with intensive lifestyle modifications for her multiple health conditions.  ALLERGIES: Allergies  Allergen Reactions  . Aspirin Anaphylaxis  . Penicillins Anaphylaxis  . Amlodipine Itching    Makes skin crawl  . Hydrochlorothiazide Other (See Comments)    "makes skin burn"  . Lisinopril Other (See Comments)    Makes sinuses drain   . Red Dye     "makes my skin crawl"  . Sulfa  Antibiotics Other (See Comments)    "makes my skin burn"  . Valtrex [Valacyclovir] Other (See Comments)    Joints swell  . Losartan Rash    MEDICATIONS: Current Outpatient Medications on File Prior to Visit  Medication Sig Dispense Refill  . atenolol (TENORMIN) 25 MG tablet Take 25 mg by mouth daily.    . Cyanocobalamin (VITAMIN B 12 PO) Take 1 capsule by mouth daily.    Marland Kitchen HYDROcodone-acetaminophen (NORCO/VICODIN)  5-325 MG tablet Take 1 tablet by mouth every 6 (six) hours as needed for moderate pain.    . Multiple Vitamin (MULTIVITAMIN WITH MINERALS) TABS tablet Take 1 tablet by mouth daily.    Marland Kitchen tiZANidine (ZANAFLEX) 4 MG capsule Take 4 mg by mouth 3 (three) times daily as needed for muscle spasms.    Marland Kitchen topiramate (TOPAMAX) 50 MG tablet Take 1 tablet (50 mg total) by mouth at bedtime. 30 tablet 0   No current facility-administered medications on file prior to visit.     PAST MEDICAL HISTORY: Past Medical History:  Diagnosis Date  . Acid reflux   . Anemia   . Anxiety   . Chronic back pain   . Chronic SI joint pain   . Constipation   . Frequent urination   . Glaucoma   . HTN (hypertension)   . Infertility, female   . Leg edema   . No pertinent past medical history   . Right hip pain   . Right leg numbness     PAST SURGICAL HISTORY: Past Surgical History:  Procedure Laterality Date  . BILATERAL SALPINGECTOMY  01/08/2013   Procedure: BILATERAL SALPINGECTOMY;  Surgeon: Marvene Staff, MD;  Location: [facility];  Service: Gynecology;  Laterality: Bilateral;  . CATARACT EXTRACTION    . CESAREAN SECTION    . DIAGNOSTIC LAPAROSCOPY  1980's  . DILATION AND CURETTAGE OF UTERUS  2011  . ROBOTIC ASSISTED TOTAL HYSTERECTOMY  01/08/2013   Procedure: ROBOTIC ASSISTED TOTAL HYSTERECTOMY;  Surgeon: Marvene Staff, MD;  Location: [facility];  Service: Gynecology;  Laterality: N/A;  3 hrs.  . WISDOM TOOTH EXTRACTION      SOCIAL HISTORY: Social History   Tobacco Use  . Smoking status: Former Smoker    Packs/day: 2.50    Years: 20.00    Pack years: 50.00    Last attempt to quit: 01/01/1995    Years since quitting: 24.3  . Smokeless tobacco: Never Used  Substance Use Topics  . Alcohol use: Yes    Comment: occasional  . Drug use: No    FAMILY HISTORY: Family History  Problem Relation Age of Onset  . Breast cancer Cousin   . Obesity Mother   . Cancer Father     ROS: Review of Systems   Gastrointestinal: Negative for nausea and vomiting.  Musculoskeletal:       Negative for muscle weakness.  Neurological: Positive for headaches.    PHYSICAL EXAM: Pt in no acute distress  RECENT LABS AND TESTS: BMET    Component Value Date/Time   NA 142 11/03/2018 1025   K 4.0 11/03/2018 1025   CL 102 11/03/2018 1025   CO2 24 11/03/2018 1025   GLUCOSE 98 11/03/2018 1025   GLUCOSE 130 (H) 01/09/2013 0540   BUN 10 11/03/2018 1025   CREATININE 0.72 11/03/2018 1025   CALCIUM 9.6 11/03/2018 1025   GFRNONAA 95 11/03/2018 1025   GFRAA 109 11/03/2018 1025   Lab Results  Component Value Date   HGBA1C 6.2 (H)  11/03/2018   Lab Results  Component Value Date   INSULIN 20.5 11/03/2018   CBC    Component Value Date/Time   WBC 7.8 11/03/2018 1025   WBC 12.5 (H) 01/09/2013 0540   RBC 4.32 11/03/2018 1025   RBC 4.11 01/09/2013 0540   HGB 12.9 11/03/2018 1025   HCT 38.6 11/03/2018 1025   PLT 175 01/09/2013 0540   MCV 89 11/03/2018 1025   MCH 29.9 11/03/2018 1025   MCH 29.9 01/09/2013 0540   MCHC 33.4 11/03/2018 1025   MCHC 32.6 01/09/2013 0540   RDW 13.2 11/03/2018 1025   LYMPHSABS 1.8 11/03/2018 1025   EOSABS 0.1 11/03/2018 1025   BASOSABS 0.0 11/03/2018 1025   Iron/TIBC/Ferritin/ %Sat No results found for: IRON, TIBC, FERRITIN, IRONPCTSAT Lipid Panel     Component Value Date/Time   CHOL 148 11/03/2018 1025   TRIG 75 11/03/2018 1025   HDL 72 11/03/2018 1025   LDLCALC 61 11/03/2018 1025   Hepatic Function Panel     Component Value Date/Time   PROT 6.9 11/03/2018 1025   ALBUMIN 4.3 11/03/2018 1025   AST 23 11/03/2018 1025   ALT 21 11/03/2018 1025   ALKPHOS 87 11/03/2018 1025   BILITOT 0.3 11/03/2018 1025      Component Value Date/Time   TSH 1.180 11/03/2018 1025    Results for AMORY, [id number] (MRN [id number]) as of 05/07/2019 12:30  Ref. Range 11/03/2018 10:25  Vitamin D, 25-Hydroxy Latest Ref Range: 30.0 - 100.0 ng/mL 21.9 (L)    I, Marcille Blanco, CMA,  am acting as transcriptionist for Starlyn Skeans, MD I have reviewed the above documentation for accuracy and completeness, and I agree with the above. -Dennard Nip, MD

## 2019-05-14 DIAGNOSIS — H33302 Unspecified retinal break, left eye: Secondary | ICD-10-CM | POA: Diagnosis not present

## 2019-05-14 DIAGNOSIS — J301 Allergic rhinitis due to pollen: Secondary | ICD-10-CM | POA: Diagnosis not present

## 2019-05-14 DIAGNOSIS — H04123 Dry eye syndrome of bilateral lacrimal glands: Secondary | ICD-10-CM | POA: Diagnosis not present

## 2019-05-14 DIAGNOSIS — H401131 Primary open-angle glaucoma, bilateral, mild stage: Secondary | ICD-10-CM | POA: Diagnosis not present

## 2019-05-15 ENCOUNTER — Ambulatory Visit
Admission: RE | Admit: 2019-05-15 | Discharge: 2019-05-15 | Disposition: A | Discharge status: Home / Self Care | Payer: BLUE CROSS/BLUE SHIELD | Source: Ambulatory Visit | Attending: Obstetrics and Gynecology | Admitting: Obstetrics and Gynecology

## 2019-05-15 ENCOUNTER — Other Ambulatory Visit: Payer: Self-pay

## 2019-05-15 DIAGNOSIS — Z1231 Encounter for screening mammogram for malignant neoplasm of breast: Secondary | ICD-10-CM

## 2019-05-21 ENCOUNTER — Ambulatory Visit: Payer: BLUE CROSS/BLUE SHIELD

## 2019-05-26 ENCOUNTER — Encounter (INDEPENDENT_AMBULATORY_CARE_PROVIDER_SITE_OTHER): Payer: Self-pay | Admitting: Family Medicine

## 2019-05-26 ENCOUNTER — Ambulatory Visit (INDEPENDENT_AMBULATORY_CARE_PROVIDER_SITE_OTHER): Payer: BLUE CROSS/BLUE SHIELD | Admitting: Family Medicine

## 2019-05-26 ENCOUNTER — Other Ambulatory Visit: Payer: Self-pay

## 2019-05-26 DIAGNOSIS — Z6836 Body mass index (BMI) 36.0-36.9, adult: Secondary | ICD-10-CM

## 2019-05-26 DIAGNOSIS — E559 Vitamin D deficiency, unspecified: Secondary | ICD-10-CM

## 2019-05-26 DIAGNOSIS — I10 Essential (primary) hypertension: Secondary | ICD-10-CM | POA: Diagnosis not present

## 2019-05-26 MED ORDER — VITAMIN D (ERGOCALCIFEROL) 1.25 MG (50000 UNIT) PO CAPS: 50000.0000 [IU] | ORAL_CAPSULE | ORAL | 0 refills | Status: DC

## 2019-05-26 NOTE — Progress Notes (Signed)
Office: [phone]  /  Fax: [phone] TeleHealth Visit:  Tonya Clarke has verbally consented to this TeleHealth visit today. The patient is located at home, the provider is located at the [facility] office. The participants in this visit include the listed provider and patient. The visit was conducted today via facetime.  HPI:   Chief Complaint: OBESITY Tonya Clarke is here to discuss her progress with her obesity treatment plan. She is on the  keep a food journal with 1200 calories and 70g of protein  and is following her eating plan approximately 20 % of the time. She states she is exercising by walking for 30 minutes 6 times per week. Tonya Clarke feels she is doing well maintaining her weight. She notes less daytime snacking at home. She is not journaling, she is not counting her protein and when she describes what she is eating she is likely eating 30g of protein a day.   We were unable to weigh the patient today for this TeleHealth visit. She feels as if she has maintained weight since her last visit. She has lost 0 lbs since starting treatment with [place].  Vitamin D deficiency Tonya Clarke has a diagnosis of vitamin D deficiency. She is currently taking vit D and denies nausea, vomiting or muscle weakness. Her vitamin D level is not yet at goal.   Hypertension Tonya Clarke is a 58 y.o. female with hypertension.  Tonya Clarke denies chest pain, headache or shortness of breath on exertion. She is working weight loss to help control her blood pressure with the goal of decreasing her risk of heart attack and stroke. Tonya Clarke does not check bp at home. Her last few blood pressure readings in officer were increased. She is working on diet to help.     ASSESSMENT AND PLAN:  Vitamin D deficiency - Plan: Vitamin D, Ergocalciferol, (DRISDOL) 1.25 MG (50000 UT) CAPS capsule  Essential hypertension  Class 2 severe obesity with serious comorbidity and body mass  index (BMI) of 36.0 to 36.9 in adult, unspecified obesity type ([place])  PLAN: Vitamin D Deficiency Tonya Clarke was informed that low vitamin D levels contributes to fatigue and are associated with obesity, breast, and colon cancer. She agrees to continue to take prescription Vit D @50 ,000 IU every week #4 with no refills and will follow up for routine testing of vitamin D, at least 2-3 times per year. She was informed of the risk of over-replacement of vitamin D and agrees to not increase her dose unless she discusses this with [place] first.  Hypertension We discussed sodium restriction, working on healthy weight loss, and a regular exercise program as the means to achieve improved blood pressure control. Tonya Clarke agreed with this plan and agreed to follow up as directed. We will continue to monitor her blood pressure as well as her progress with the above lifestyle modifications. She will continue her medications as prescribed and will watch for signs of hypotension as she continues her lifestyle modifications. We will recheck blood pressure in office when possible.   I spent > than 50% of the 25 minute visit on counseling as documented in the note.  Obesity Tonya Clarke is currently in the action stage of change. As such, her goal is to continue with weight loss efforts She has agreed change to follow our protein rich vegetarian plan  She was sent vegetarian plan via MyChart.  Keith has been instructed to work up to a goal of 150 minutes of combined cardio  and strengthening exercise per week for weight loss and overall health benefits. We discussed the following Behavioral Modification Stratagies today: increasing lean protein intake, decreasing simple carbohydrates  and work on meal planning and easy cooking plans   Tonya Clarke has agreed to follow up with our clinic in 2 weeks. She was informed of the importance of frequent follow up visits to maximize her success with intensive lifestyle  modifications for her multiple health conditions.  ALLERGIES: Allergies  Allergen Reactions  . Aspirin Anaphylaxis  . Penicillins Anaphylaxis  . Amlodipine Itching    Makes skin crawl  . Hydrochlorothiazide Other (See Comments)    "makes skin burn"  . Lisinopril Other (See Comments)    Makes sinuses drain   . Red Dye     "makes my skin crawl"  . Sulfa Antibiotics Other (See Comments)    "makes my skin burn"  . Valtrex [Valacyclovir] Other (See Comments)    Joints swell  . Losartan Rash    MEDICATIONS: Current Outpatient Medications on File Prior to Visit  Medication Sig Dispense Refill  . atenolol (TENORMIN) 25 MG tablet Take 25 mg by mouth daily.    . Cyanocobalamin (VITAMIN B 12 PO) Take 1 capsule by mouth daily.    Marland Kitchen HYDROcodone-acetaminophen (NORCO/VICODIN) 5-325 MG tablet Take 1 tablet by mouth every 6 (six) hours as needed for moderate pain.    . Multiple Vitamin (MULTIVITAMIN WITH MINERALS) TABS tablet Take 1 tablet by mouth daily.    Marland Kitchen tiZANidine (ZANAFLEX) 4 MG capsule Take 4 mg by mouth 3 (three) times daily as needed for muscle spasms.    Marland Kitchen topiramate (TOPAMAX) 50 MG tablet Take 1 tablet (50 mg total) by mouth at bedtime. 30 tablet 0   No current facility-administered medications on file prior to visit.     PAST MEDICAL HISTORY: Past Medical History:  Diagnosis Date  . Acid reflux   . Anemia   . Anxiety   . Chronic back pain   . Chronic SI joint pain   . Constipation   . Frequent urination   . Glaucoma   . HTN (hypertension)   . Infertility, female   . Leg edema   . No pertinent past medical history   . Right hip pain   . Right leg numbness     PAST SURGICAL HISTORY: Past Surgical History:  Procedure Laterality Date  . BILATERAL SALPINGECTOMY  01/08/2013   Procedure: BILATERAL SALPINGECTOMY;  Surgeon: Marvene Staff, MD;  Location: [facility];  Service: Gynecology;  Laterality: Bilateral;  . CATARACT EXTRACTION    . CESAREAN SECTION    .  DIAGNOSTIC LAPAROSCOPY  1980's  . DILATION AND CURETTAGE OF UTERUS  2011  . ROBOTIC ASSISTED TOTAL HYSTERECTOMY  01/08/2013   Procedure: ROBOTIC ASSISTED TOTAL HYSTERECTOMY;  Surgeon: Marvene Staff, MD;  Location: [facility];  Service: Gynecology;  Laterality: N/A;  3 hrs.  . WISDOM TOOTH EXTRACTION      SOCIAL HISTORY: Social History   Tobacco Use  . Smoking status: Former Smoker    Packs/day: 2.50    Years: 20.00    Pack years: 50.00    Last attempt to quit: 01/01/1995    Years since quitting: 24.4  . Smokeless tobacco: Never Used  Substance Use Topics  . Alcohol use: Yes    Comment: occasional  . Drug use: No    FAMILY HISTORY: Family History  Problem Relation Age of Onset  . Breast cancer Cousin   . Obesity Mother   .  Cancer Father     ROS: Review of Systems  Respiratory: Negative for shortness of breath.   Cardiovascular: Negative for chest pain.  Gastrointestinal: Negative for nausea and vomiting.  Musculoskeletal:       Negative for muscle weakness  Neurological: Negative for headaches.    PHYSICAL EXAM: Pt in no acute distress  RECENT LABS AND TESTS: BMET    Component Value Date/Time   NA 142 11/03/2018 1025   K 4.0 11/03/2018 1025   CL 102 11/03/2018 1025   CO2 24 11/03/2018 1025   GLUCOSE 98 11/03/2018 1025   GLUCOSE 130 (H) 01/09/2013 0540   BUN 10 11/03/2018 1025   CREATININE 0.72 11/03/2018 1025   CALCIUM 9.6 11/03/2018 1025   GFRNONAA 95 11/03/2018 1025   GFRAA 109 11/03/2018 1025   Lab Results  Component Value Date   HGBA1C 6.2 (H) 11/03/2018   Lab Results  Component Value Date   INSULIN 20.5 11/03/2018   CBC    Component Value Date/Time   WBC 7.8 11/03/2018 1025   WBC 12.5 (H) 01/09/2013 0540   RBC 4.32 11/03/2018 1025   RBC 4.11 01/09/2013 0540   HGB 12.9 11/03/2018 1025   HCT 38.6 11/03/2018 1025   PLT 175 01/09/2013 0540   MCV 89 11/03/2018 1025   MCH 29.9 11/03/2018 1025   MCH 29.9 01/09/2013 0540   MCHC 33.4  11/03/2018 1025   MCHC 32.6 01/09/2013 0540   RDW 13.2 11/03/2018 1025   LYMPHSABS 1.8 11/03/2018 1025   EOSABS 0.1 11/03/2018 1025   BASOSABS 0.0 11/03/2018 1025   Iron/TIBC/Ferritin/ %Sat No results found for: IRON, TIBC, FERRITIN, IRONPCTSAT Lipid Panel     Component Value Date/Time   CHOL 148 11/03/2018 1025   TRIG 75 11/03/2018 1025   HDL 72 11/03/2018 1025   LDLCALC 61 11/03/2018 1025   Hepatic Function Panel     Component Value Date/Time   PROT 6.9 11/03/2018 1025   ALBUMIN 4.3 11/03/2018 1025   AST 23 11/03/2018 1025   ALT 21 11/03/2018 1025   ALKPHOS 87 11/03/2018 1025   BILITOT 0.3 11/03/2018 1025      Component Value Date/Time   TSH 1.180 11/03/2018 1025     Ref. Range 11/03/2018 10:25  Vitamin D, 25-Hydroxy Latest Ref Range: 30.0 - 100.0 ng/mL 21.9 (L)     I, Renee Ramus, am acting as Location manager for Dennard Nip, MD  I have reviewed the above documentation for accuracy and completeness, and I agree with the above. -Dennard Nip, MD

## 2019-05-28 DIAGNOSIS — M461 Sacroiliitis, not elsewhere classified: Secondary | ICD-10-CM | POA: Diagnosis not present

## 2019-05-28 DIAGNOSIS — M47816 Spondylosis without myelopathy or radiculopathy, lumbar region: Secondary | ICD-10-CM | POA: Diagnosis not present

## 2019-05-28 DIAGNOSIS — M5136 Other intervertebral disc degeneration, lumbar region: Secondary | ICD-10-CM | POA: Diagnosis not present

## 2019-06-11 ENCOUNTER — Encounter (INDEPENDENT_AMBULATORY_CARE_PROVIDER_SITE_OTHER): Payer: Self-pay | Admitting: Family Medicine

## 2019-06-11 ENCOUNTER — Ambulatory Visit (INDEPENDENT_AMBULATORY_CARE_PROVIDER_SITE_OTHER): Payer: BC Managed Care – PPO | Admitting: Family Medicine

## 2019-06-11 ENCOUNTER — Other Ambulatory Visit: Payer: Self-pay

## 2019-06-11 DIAGNOSIS — E559 Vitamin D deficiency, unspecified: Secondary | ICD-10-CM

## 2019-06-11 DIAGNOSIS — Z6836 Body mass index (BMI) 36.0-36.9, adult: Secondary | ICD-10-CM

## 2019-06-11 DIAGNOSIS — I10 Essential (primary) hypertension: Secondary | ICD-10-CM

## 2019-06-11 MED ORDER — VITAMIN D (ERGOCALCIFEROL) 1.25 MG (50000 UNIT) PO CAPS: 50000.0000 [IU] | ORAL_CAPSULE | ORAL | 0 refills | Status: DC

## 2019-06-16 NOTE — Progress Notes (Signed)
Office: [phone]  /  Fax: [phone] TeleHealth Visit:  Tonya Clarke has verbally consented to this TeleHealth visit today. The patient is located at home, the provider is located at the [employer] [facility] office. The participants in this visit include the listed provider and patient and any and all parties involved. The visit was conducted today via FaceTime.  HPI:   Chief Complaint: OBESITY Tonya Clarke is here to discuss her progress with her obesity treatment plan. She is on the protein rich vegetarian plan and is following her eating plan approximately 100 % of the time. She states she is walking 45 minutes 6 times per week. Tonya Clarke states she followed our 1,200 calorie vegetarian  Plan 100% and she gained 4 pounds. She is walking 45 minutes, 6 times per week. Tonya Clarke does feel bloated and she wonders if she is retaining fluid. We were unable to weigh the patient today for this TeleHealth visit. She feels as if she has gained weight since her last visit. She has lost 0 lbs since starting treatment with [place].  Hypertension Tonya Clarke is a 58 y.o. female with hypertension. Her blood pressure at home was at 133/80 yesterday. Tonya Clarke denies chest pain or lightheadedness. She is working weight loss to help control her blood pressure with the goal of decreasing her risk of heart attack and stroke. Tonya Clarke blood pressure is currently controlled.  Vitamin D deficiency Tonya Clarke has a diagnosis of vitamin D deficiency. Tonya Clarke is stable on vit D. She is not yet at goal. Tonya Clarke denies nausea, vomiting or muscle weakness.  ASSESSMENT AND PLAN:  Essential hypertension  Vitamin D deficiency - Plan: Vitamin D, Ergocalciferol, (DRISDOL) 1.25 MG (50000 UT) CAPS capsule  Class 2 severe obesity with serious comorbidity and body mass index (BMI) of 36.0 to 36.9 in adult, unspecified obesity type ([place])  PLAN:  Hypertension We discussed sodium  restriction, working on healthy weight loss, and a regular exercise program as the means to achieve improved blood pressure control. Tonya Clarke agreed to continue diet and exercise and she will check her blood pressure at home daily. We will continue to monitor her blood pressure as well as her progress with the above lifestyle modifications. She will continue her medications as prescribed and will watch for signs of hypotension as she continues her lifestyle modifications.  Vitamin D Deficiency Tonya Clarke was informed that low vitamin D levels contributes to fatigue and are associated with obesity, breast, and colon cancer. She agrees to continue to take prescription Vit D @50 ,000 IU every week #4 with no refills and will follow up for routine testing of vitamin D, at least 2-3 times per year. She was informed of the risk of over-replacement of vitamin D and agrees to not increase her dose unless she discusses this with [place] first. Tonya Clarke agrees to follow up as directed.  Obesity Tonya Clarke is currently in the action stage of change. As such, her goal is to continue with weight loss efforts She has agreed to follow our protein rich vegetarian plan Tonya Clarke has been instructed to work up to a goal of 150 minutes of combined cardio and strengthening exercise per week for weight loss and overall health benefits. We discussed the following Behavioral Modification Strategies today: increase H2O intake, decreasing sodium intake and work on meal planning and easy cooking plans  If patient is truly following our diet prescription, the increase in weight is likely due to fluid retention. We will be able to monitor this  with our BI scale when the patient is seen in the office next.  Tonya Clarke has agreed to follow up with our clinic in 2 weeks. She was informed of the importance of frequent follow up visits to maximize her success with intensive lifestyle modifications for her multiple health conditions.   ALLERGIES: Allergies  Allergen Reactions  . Aspirin Anaphylaxis  . Penicillins Anaphylaxis  . Amlodipine Itching    Makes skin crawl  . Hydrochlorothiazide Other (See Comments)    "makes skin burn"  . Lisinopril Other (See Comments)    Makes sinuses drain   . Red Dye     "makes my skin crawl"  . Sulfa Antibiotics Other (See Comments)    "makes my skin burn"  . Valtrex [Valacyclovir] Other (See Comments)    Joints swell  . Losartan Rash    MEDICATIONS: Current Outpatient Medications on File Prior to Visit  Medication Sig Dispense Refill  . atenolol (TENORMIN) 25 MG tablet Take 25 mg by mouth daily.    . Cyanocobalamin (VITAMIN B 12 PO) Take 1 capsule by mouth daily.    Marland Kitchen HYDROcodone-acetaminophen (NORCO/VICODIN) 5-325 MG tablet Take 1 tablet by mouth every 6 (six) hours as needed for moderate pain.    . Multiple Vitamin (MULTIVITAMIN WITH MINERALS) TABS tablet Take 1 tablet by mouth daily.    Marland Kitchen tiZANidine (ZANAFLEX) 4 MG capsule Take 4 mg by mouth 3 (three) times daily as needed for muscle spasms.    Marland Kitchen topiramate (TOPAMAX) 50 MG tablet Take 1 tablet (50 mg total) by mouth at bedtime. 30 tablet 0   No current facility-administered medications on file prior to visit.     PAST MEDICAL HISTORY: Past Medical History:  Diagnosis Date  . Acid reflux   . Anemia   . Anxiety   . Chronic back pain   . Chronic SI joint pain   . Constipation   . Frequent urination   . Glaucoma   . HTN (hypertension)   . Infertility, female   . Leg edema   . No pertinent past medical history   . Right hip pain   . Right leg numbness     PAST SURGICAL HISTORY: Past Surgical History:  Procedure Laterality Date  . BILATERAL SALPINGECTOMY  01/08/2013   Procedure: BILATERAL SALPINGECTOMY;  Surgeon: Marvene Staff, MD;  Location: [facility];  Service: Gynecology;  Laterality: Bilateral;  . CATARACT EXTRACTION    . CESAREAN SECTION    . DIAGNOSTIC LAPAROSCOPY  1980's  . DILATION AND CURETTAGE  OF UTERUS  2011  . ROBOTIC ASSISTED TOTAL HYSTERECTOMY  01/08/2013   Procedure: ROBOTIC ASSISTED TOTAL HYSTERECTOMY;  Surgeon: Marvene Staff, MD;  Location: [facility];  Service: Gynecology;  Laterality: N/A;  3 hrs.  . WISDOM TOOTH EXTRACTION      SOCIAL HISTORY: Social History   Tobacco Use  . Smoking status: Former Smoker    Packs/day: 2.50    Years: 20.00    Pack years: 50.00    Quit date: 01/01/1995    Years since quitting: 24.4  . Smokeless tobacco: Never Used  Substance Use Topics  . Alcohol use: Yes    Comment: occasional  . Drug use: No    FAMILY HISTORY: Family History  Problem Relation Age of Onset  . Breast cancer Cousin   . Obesity Mother   . Cancer Father     ROS: Review of Systems  Constitutional: Negative for weight loss.  Cardiovascular: Negative for chest pain.  Gastrointestinal:  Negative for nausea and vomiting.  Musculoskeletal:       Negative for muscle weakness  Neurological:       Negative for lightheadedness    PHYSICAL EXAM: Pt in no acute distress  RECENT LABS AND TESTS: BMET    Component Value Date/Time   NA 142 11/03/2018 1025   K 4.0 11/03/2018 1025   CL 102 11/03/2018 1025   CO2 24 11/03/2018 1025   GLUCOSE 98 11/03/2018 1025   GLUCOSE 130 (H) 01/09/2013 0540   BUN 10 11/03/2018 1025   CREATININE 0.72 11/03/2018 1025   CALCIUM 9.6 11/03/2018 1025   GFRNONAA 95 11/03/2018 1025   GFRAA 109 11/03/2018 1025   Lab Results  Component Value Date   HGBA1C 6.2 (H) 11/03/2018   Lab Results  Component Value Date   INSULIN 20.5 11/03/2018   CBC    Component Value Date/Time   WBC 7.8 11/03/2018 1025   WBC 12.5 (H) 01/09/2013 0540   RBC 4.32 11/03/2018 1025   RBC 4.11 01/09/2013 0540   HGB 12.9 11/03/2018 1025   HCT 38.6 11/03/2018 1025   PLT 175 01/09/2013 0540   MCV 89 11/03/2018 1025   MCH 29.9 11/03/2018 1025   MCH 29.9 01/09/2013 0540   MCHC 33.4 11/03/2018 1025   MCHC 32.6 01/09/2013 0540   RDW 13.2 11/03/2018  1025   LYMPHSABS 1.8 11/03/2018 1025   EOSABS 0.1 11/03/2018 1025   BASOSABS 0.0 11/03/2018 1025   Iron/TIBC/Ferritin/ %Sat No results found for: IRON, TIBC, FERRITIN, IRONPCTSAT Lipid Panel     Component Value Date/Time   CHOL 148 11/03/2018 1025   TRIG 75 11/03/2018 1025   HDL 72 11/03/2018 1025   LDLCALC 61 11/03/2018 1025   Hepatic Function Panel     Component Value Date/Time   PROT 6.9 11/03/2018 1025   ALBUMIN 4.3 11/03/2018 1025   AST 23 11/03/2018 1025   ALT 21 11/03/2018 1025   ALKPHOS 87 11/03/2018 1025   BILITOT 0.3 11/03/2018 1025      Component Value Date/Time   TSH 1.180 11/03/2018 1025     Ref. Range 11/03/2018 10:25  Vitamin D, 25-Hydroxy Latest Ref Range: 30.0 - 100.0 ng/mL 21.9 (L)    I, Doreene Nest, am acting as transcriptionist for Dennard Nip, MD I have reviewed the above documentation for accuracy and completeness, and I agree with the above. -Dennard Nip, MD

## 2019-06-29 ENCOUNTER — Ambulatory Visit (INDEPENDENT_AMBULATORY_CARE_PROVIDER_SITE_OTHER): Payer: BC Managed Care – PPO | Admitting: Family Medicine

## 2019-07-15 DIAGNOSIS — M461 Sacroiliitis, not elsewhere classified: Secondary | ICD-10-CM | POA: Diagnosis not present

## 2019-08-17 DIAGNOSIS — H1132 Conjunctival hemorrhage, left eye: Secondary | ICD-10-CM | POA: Diagnosis not present

## 2019-08-17 DIAGNOSIS — H04123 Dry eye syndrome of bilateral lacrimal glands: Secondary | ICD-10-CM | POA: Diagnosis not present

## 2019-08-17 DIAGNOSIS — H33302 Unspecified retinal break, left eye: Secondary | ICD-10-CM | POA: Diagnosis not present

## 2019-08-17 DIAGNOSIS — H401131 Primary open-angle glaucoma, bilateral, mild stage: Secondary | ICD-10-CM | POA: Diagnosis not present

## 2019-09-17 DIAGNOSIS — Z6838 Body mass index (BMI) 38.0-38.9, adult: Secondary | ICD-10-CM | POA: Diagnosis not present

## 2019-09-17 DIAGNOSIS — I10 Essential (primary) hypertension: Secondary | ICD-10-CM | POA: Diagnosis not present

## 2019-09-17 DIAGNOSIS — Z79899 Other long term (current) drug therapy: Secondary | ICD-10-CM | POA: Diagnosis not present

## 2019-09-17 DIAGNOSIS — E559 Vitamin D deficiency, unspecified: Secondary | ICD-10-CM | POA: Diagnosis not present

## 2019-09-17 DIAGNOSIS — Z87891 Personal history of nicotine dependence: Secondary | ICD-10-CM | POA: Diagnosis not present

## 2019-10-08 DIAGNOSIS — Z6838 Body mass index (BMI) 38.0-38.9, adult: Secondary | ICD-10-CM | POA: Diagnosis not present

## 2019-10-08 DIAGNOSIS — Z01419 Encounter for gynecological examination (general) (routine) without abnormal findings: Secondary | ICD-10-CM | POA: Diagnosis not present

## 2019-11-12 DIAGNOSIS — H18519 Endothelial corneal dystrophy, unspecified eye: Secondary | ICD-10-CM | POA: Diagnosis not present

## 2019-11-12 DIAGNOSIS — J301 Allergic rhinitis due to pollen: Secondary | ICD-10-CM | POA: Diagnosis not present

## 2019-11-12 DIAGNOSIS — H401131 Primary open-angle glaucoma, bilateral, mild stage: Secondary | ICD-10-CM | POA: Diagnosis not present

## 2019-11-12 DIAGNOSIS — H04123 Dry eye syndrome of bilateral lacrimal glands: Secondary | ICD-10-CM | POA: Diagnosis not present

## 2019-12-22 DIAGNOSIS — I10 Essential (primary) hypertension: Secondary | ICD-10-CM | POA: Diagnosis not present

## 2020-01-05 DIAGNOSIS — H401131 Primary open-angle glaucoma, bilateral, mild stage: Secondary | ICD-10-CM | POA: Diagnosis not present

## 2020-01-05 DIAGNOSIS — H04123 Dry eye syndrome of bilateral lacrimal glands: Secondary | ICD-10-CM | POA: Diagnosis not present

## 2020-01-05 DIAGNOSIS — Z961 Presence of intraocular lens: Secondary | ICD-10-CM | POA: Diagnosis not present

## 2020-01-05 DIAGNOSIS — H18513 Endothelial corneal dystrophy, bilateral: Secondary | ICD-10-CM | POA: Diagnosis not present

## 2020-01-29 DIAGNOSIS — I10 Essential (primary) hypertension: Secondary | ICD-10-CM | POA: Diagnosis not present

## 2020-03-24 DIAGNOSIS — I10 Essential (primary) hypertension: Secondary | ICD-10-CM | POA: Diagnosis not present

## 2020-03-24 DIAGNOSIS — Z8349 Family history of other endocrine, nutritional and metabolic diseases: Secondary | ICD-10-CM | POA: Diagnosis not present

## 2020-03-24 DIAGNOSIS — Z9189 Other specified personal risk factors, not elsewhere classified: Secondary | ICD-10-CM | POA: Diagnosis not present

## 2020-04-18 DIAGNOSIS — R0683 Snoring: Secondary | ICD-10-CM | POA: Diagnosis not present

## 2020-05-06 ENCOUNTER — Other Ambulatory Visit: Payer: Self-pay | Admitting: Obstetrics and Gynecology

## 2020-05-06 DIAGNOSIS — Z1231 Encounter for screening mammogram for malignant neoplasm of breast: Secondary | ICD-10-CM

## 2020-05-17 ENCOUNTER — Ambulatory Visit: Payer: BLUE CROSS/BLUE SHIELD

## 2020-05-20 ENCOUNTER — Other Ambulatory Visit: Payer: Self-pay

## 2020-05-20 ENCOUNTER — Ambulatory Visit
Admission: RE | Admit: 2020-05-20 | Discharge: 2020-05-20 | Disposition: A | Discharge status: Home / Self Care | Payer: BC Managed Care – PPO | Source: Ambulatory Visit | Attending: Obstetrics and Gynecology | Admitting: Obstetrics and Gynecology

## 2020-05-20 DIAGNOSIS — Z1231 Encounter for screening mammogram for malignant neoplasm of breast: Secondary | ICD-10-CM | POA: Diagnosis not present

## 2020-06-12 DIAGNOSIS — G4733 Obstructive sleep apnea (adult) (pediatric): Secondary | ICD-10-CM | POA: Diagnosis not present

## 2020-06-27 ENCOUNTER — Encounter (HOSPITAL_BASED_OUTPATIENT_CLINIC_OR_DEPARTMENT_OTHER): Payer: Self-pay | Admitting: *Deleted

## 2020-06-27 ENCOUNTER — Emergency Department (HOSPITAL_BASED_OUTPATIENT_CLINIC_OR_DEPARTMENT_OTHER): Payer: BC Managed Care – PPO

## 2020-06-27 ENCOUNTER — Emergency Department (HOSPITAL_BASED_OUTPATIENT_CLINIC_OR_DEPARTMENT_OTHER)
Admission: EM | Admit: 2020-06-27 | Discharge: 2020-06-27 | Disposition: A | Discharge status: Home / Self Care | Payer: BC Managed Care – PPO | Attending: Emergency Medicine | Admitting: Emergency Medicine

## 2020-06-27 ENCOUNTER — Other Ambulatory Visit: Payer: Self-pay

## 2020-06-27 DIAGNOSIS — Z87891 Personal history of nicotine dependence: Secondary | ICD-10-CM | POA: Diagnosis not present

## 2020-06-27 DIAGNOSIS — R42 Dizziness and giddiness: Secondary | ICD-10-CM | POA: Insufficient documentation

## 2020-06-27 DIAGNOSIS — R079 Chest pain, unspecified: Secondary | ICD-10-CM | POA: Diagnosis not present

## 2020-06-27 DIAGNOSIS — Z79899 Other long term (current) drug therapy: Secondary | ICD-10-CM | POA: Diagnosis not present

## 2020-06-27 DIAGNOSIS — I10 Essential (primary) hypertension: Secondary | ICD-10-CM | POA: Diagnosis not present

## 2020-06-27 DIAGNOSIS — R519 Headache, unspecified: Secondary | ICD-10-CM | POA: Diagnosis not present

## 2020-06-27 LAB — CBC
HCT: 43.5 % (ref 36.0–46.0)
Hemoglobin: 13.5 g/dL (ref 12.0–15.0)
MCH: 29.3 pg (ref 26.0–34.0)
MCHC: 31 g/dL (ref 30.0–36.0)
MCV: 94.6 fL (ref 80.0–100.0)
Platelets: 211 10*3/uL (ref 150–400)
RBC: 4.6 MIL/uL (ref 3.87–5.11)
RDW: 13.3 % (ref 11.5–15.5)
WBC: 5 10*3/uL (ref 4.0–10.5)
nRBC: 0 % (ref 0.0–0.2)

## 2020-06-27 LAB — URINALYSIS, ROUTINE W REFLEX MICROSCOPIC
Bilirubin Urine: NEGATIVE
Glucose, UA: NEGATIVE mg/dL
Hgb urine dipstick: NEGATIVE
Ketones, ur: NEGATIVE mg/dL
Leukocytes,Ua: NEGATIVE
Nitrite: NEGATIVE
Protein, ur: NEGATIVE mg/dL
Specific Gravity, Urine: 1.01 (ref 1.005–1.030)
pH: 7 (ref 5.0–8.0)

## 2020-06-27 LAB — BASIC METABOLIC PANEL
Anion gap: 9 (ref 5–15)
BUN: 8 mg/dL (ref 6–20)
CO2: 27 mmol/L (ref 22–32)
Calcium: 9.2 mg/dL (ref 8.9–10.3)
Chloride: 104 mmol/L (ref 98–111)
Creatinine, Ser: 0.73 mg/dL (ref 0.44–1.00)
GFR calc Af Amer: >60 mL/min (ref >60)
GFR calc non Af Amer: >60 mL/min (ref >60)
Glucose, Bld: 99 mg/dL (ref 70–99)
Potassium: 4.2 mmol/L (ref 3.5–5.1)
Sodium: 140 mmol/L (ref 135–145)

## 2020-06-27 LAB — TROPONIN I (HIGH SENSITIVITY)
Troponin I (High Sensitivity): 4 ng/L (ref <18)
Troponin I (High Sensitivity): 3 ng/L (ref <18)

## 2020-06-27 MED ORDER — ALUM & MAG HYDROXIDE-SIMETH 200-200-20 MG/5ML PO SUSP
30.0000 mL | Freq: Once | ORAL | Status: AC
  Administered 2020-06-27: 30 mL via ORAL
  Filled 2020-06-27: qty 30

## 2020-06-27 MED ORDER — SODIUM CHLORIDE 0.9% FLUSH
3.0000 mL | Freq: Once | INTRAVENOUS | Status: DC
  Filled 2020-06-27: qty 3

## 2020-06-27 MED ORDER — DIAZEPAM 5 MG PO TABS
5.0000 mg | ORAL_TABLET | Freq: Once | ORAL | Status: AC
  Administered 2020-06-27: 5 mg via ORAL
  Filled 2020-06-27: qty 1

## 2020-06-27 MED ORDER — DIAZEPAM 5 MG PO TABS: 5.0000 mg | ORAL_TABLET | Freq: Four times a day (QID) | ORAL | 0 refills | Status: AC | PRN

## 2020-06-27 NOTE — ED Triage Notes (Signed)
Dizziness this am. Chest pain off and on x 2 days. States her BP has been elevated.

## 2020-06-27 NOTE — ED Notes (Signed)
ED Provider at bedside. 

## 2020-06-27 NOTE — Discharge Instructions (Signed)
I have prescribed a short course of medication to help with your dizziness.  There is a referral placed for the hypertension advanced clinic, you will need to schedule an appointment for further management of your blood pressure.  If you experience any new headache, chest pain, shortness of breath or symptoms worsen please go to [facility] emergency department.

## 2020-06-27 NOTE — ED Notes (Signed)
Assisted pt to bathroom, stand by assist.  Pt ambulated ok.  Instructed pt to pull bathroom cord when finished and I would walk with her back to room.  When called, had to have two to assist back to room as pt states she felt really dizzy.  Pt assisted back in bed, Vital signs performed.  Johanna PA notified.  Pt placed on high fall risk precautions.

## 2020-06-27 NOTE — ED Provider Notes (Signed)
[facility] EMERGENCY DEPARTMENT Provider Note   CSN: [id number] Arrival date & time: 06/27/20  1300     History Chief Complaint  Patient presents with  . Dizziness    Tonya Clarke is a 59 y.o. female.  59 y.o female with a PMH of Acid reflux, Anemia, HTN presents to the ED with a chief complaint of elevated BP, dizziness and chest pain. Patient reports she began feeling dizzy this morning, describes it as a sensation that is intermittent, has had prior dizziness in the past and attributes this to blood pressure medications however reports this has felt worse.  She has previously been trialed on several different blood pressure medications, reports is her seventh medication for blood pressure control.  She is currently on 160 mg of valsartan, reports she only takes half due to side effects.  She is currently followed by her PCP.  Reports she has had side effects to every blood pressure medication.  Describes the dizziness as spinning in the whole room, does not vary with position changes, no prior history of vertigo.  Does feel like she is somewhat tilting to the left with ambulation but has not had any weakness to her left upper or left lower extremities.    Additionally, patient reports left-sided ache intermittent, no alleviating or exacerbating factors.  No shortness of breath with this episode.  Has not had any prior history of blood clots, no history of CAD, no family history of CAD.  Prior tobacco smoker but has not done so in several years.  Denies any headache, changes in vision, weakness or other complaint.  Cording to her chart, patient called PCP office who recommended evaluation in the ED.  Valsartan 160 mg but only takes half    The history is provided by the patient and medical records.       Past Medical History:  Diagnosis Date  . Acid reflux   . Anemia   . Anxiety   . Chronic back pain   . Chronic SI joint pain   . Constipation   . Frequent  urination   . Glaucoma   . HTN (hypertension)   . Infertility, female   . Leg edema   . No pertinent past medical history   . Right hip pain   . Right leg numbness     Patient Active Problem List   Diagnosis Date Noted  . H/O hysterectomy with oophorectomy 01/09/2013    Past Surgical History:  Procedure Laterality Date  . BILATERAL SALPINGECTOMY  01/08/2013   Procedure: BILATERAL SALPINGECTOMY;  Surgeon: Marvene Staff, MD;  Location: [facility];  Service: Gynecology;  Laterality: Bilateral;  . CATARACT EXTRACTION    . CESAREAN SECTION    . DIAGNOSTIC LAPAROSCOPY  1980's  . DILATION AND CURETTAGE OF UTERUS  2011  . ROBOTIC ASSISTED TOTAL HYSTERECTOMY  01/08/2013   Procedure: ROBOTIC ASSISTED TOTAL HYSTERECTOMY;  Surgeon: Marvene Staff, MD;  Location: [facility];  Service: Gynecology;  Laterality: N/A;  3 hrs.  . WISDOM TOOTH EXTRACTION       OB History    Gravida  2   Para      Term      Preterm      AB  1   Living  1     SAB      TAB      Ectopic      Multiple      Live Births  Family History  Problem Relation Age of Onset  . Breast cancer Cousin   . Obesity Mother   . Cancer Father     Social History   Tobacco Use  . Smoking status: Former Smoker    Packs/day: 2.50    Years: 20.00    Pack years: 50.00    Quit date: 01/01/1995    Years since quitting: 25.5  . Smokeless tobacco: Never Used  Vaping Use  . Vaping Use: Never used  Substance Use Topics  . Alcohol use: Yes    Comment: occasional  . Drug use: No    Home Medications Prior to Admission medications   Medication Sig Start Date End Date Taking? Authorizing Provider  cycloSPORINE, PF, (CEQUA) 0.09 % SOLN Apply to eye. 08/19/19  Yes [provider]  olopatadine (PATANOL) 0.1 % ophthalmic solution  06/30/18  Yes [provider]  atenolol (TENORMIN) 25 MG tablet Take 25 mg by mouth daily.    [provider]  Cyanocobalamin (VITAMIN B 12 PO)  Take 1 capsule by mouth daily.    [provider]  diazepam (VALIUM) 5 MG tablet Take 1 tablet (5 mg total) by mouth every 6 (six) hours as needed for up to 5 days. 06/27/20 07/02/20  Janeece Fitting, PA-C  HYDROcodone-acetaminophen (NORCO/VICODIN) 5-325 MG tablet Take 1 tablet by mouth every 6 (six) hours as needed for moderate pain.    [provider]  Multiple Vitamin (MULTIVITAMIN WITH MINERALS) TABS tablet Take 1 tablet by mouth daily.    [provider]  tiZANidine (ZANAFLEX) 4 MG capsule Take 4 mg by mouth 3 (three) times daily as needed for muscle spasms.    [provider]  topiramate (TOPAMAX) 50 MG tablet Take 1 tablet (50 mg total) by mouth at bedtime. 03/12/19   Dennard Nip D, MD  valsartan (DIOVAN) 160 MG tablet Take 160 mg by mouth daily. 04/25/20   [provider]  Vitamin D, Ergocalciferol, (DRISDOL) 1.25 MG (50000 UT) CAPS capsule Take 1 capsule (50,000 Units total) by mouth every 7 (seven) days. 06/11/19   Dennard Nip D, MD    Allergies    Aspirin, Penicillins, Amlodipine, Hydrochlorothiazide, Lisinopril, Red dye, Sulfa antibiotics, Valtrex [valacyclovir], and Losartan  Review of Systems   Review of Systems  Constitutional: Negative for chills and fever.  HENT: Negative for sore throat.   Respiratory: Negative for shortness of breath.   Cardiovascular: Positive for chest pain.  Gastrointestinal: Negative for abdominal pain, diarrhea, nausea and vomiting.  Genitourinary: Negative for frequency.  Musculoskeletal: Negative for back pain.  Skin: Negative for pallor and wound.  Neurological: Positive for dizziness. Negative for tremors, syncope, facial asymmetry, light-headedness and headaches.  All other systems reviewed and are negative.   Physical Exam Updated Vital Signs BP (!) 162/70   Pulse 62   Temp 98.3 F (36.8 C) (Oral)   Resp (!) 22   Ht 5\' 2"  (1.575 m)   Wt 97.5 kg   LMP 12/19/2012   SpO2 98%   BMI 39.32 kg/m     Physical Exam Vitals and nursing note reviewed.  Constitutional:      Appearance: Normal appearance. She is not ill-appearing or toxic-appearing.  HENT:     Head: Normocephalic and atraumatic.     Mouth/Throat:     Mouth: Mucous membranes are moist.  Eyes:     Pupils: Pupils are equal, round, and reactive to light.  Cardiovascular:     Rate and Rhythm: Normal rate.  Abdominal:     General: Abdomen is flat.     Tenderness: There is no abdominal tenderness.  Skin:    General: Skin is warm and dry.  Neurological:     Mental Status: She is alert and oriented to person, place, and time.     Comments: Alert, oriented, thought content appropriate. Speech fluent without evidence of aphasia. Able to follow 2 step commands without difficulty.  Cranial Nerves:  II:  Peripheral visual fields grossly normal, pupils, round, reactive to light III,IV, VI: ptosis not present, extra-ocular motions intact bilaterally  V,VII: smile symmetric, facial light touch sensation equal VIII: hearing grossly normal bilaterally  IX,X: midline uvula rise  XI: bilateral shoulder shrug equal and strong XII: midline tongue extension  Motor:  5/5 in upper and lower extremities bilaterally including strong and equal grip strength and dorsiflexion/plantar flexion Sensory: light touch normal in all extremities.  Cerebellar: normal finger-to-nose with bilateral upper extremities, pronator drift negative     ED Results / Procedures / Treatments   Labs (all labs ordered are listed, but only abnormal results are displayed) Labs Reviewed  URINALYSIS, ROUTINE W REFLEX MICROSCOPIC - Abnormal; Notable for the following components:      Result Value   Color, Urine STRAW (*)    All other components within normal limits  BASIC METABOLIC PANEL  CBC  TROPONIN I (HIGH SENSITIVITY)  TROPONIN I (HIGH SENSITIVITY)    EKG EKG Interpretation  Date/Time:  Monday June 27 2020 13:26:46 EDT Ventricular Rate:  66 PR  Interval:  170 QRS Duration: 74 QT Interval:  394 QTC Calculation: 413 R Axis:   -6 Text Interpretation: Normal sinus rhythm Normal ECG Confirmed by Veryl Speak [phone]) on 06/27/2020 1:38:26 PM   Radiology DG Chest 2 View  Result Date: 06/27/2020 CLINICAL DATA:  Chest pain and dizziness EXAM: CHEST - 2 VIEW COMPARISON:  December 03, 2018 FINDINGS: The lungs are clear. The heart size and pulmonary vascularity are normal. No adenopathy. No pneumothorax. No bone lesions. IMPRESSION: No abnormality noted. Electronically Signed   By: Lowella Grip III M.D.   On: 06/27/2020 14:05   CT Head Wo Contrast  Result Date: 06/27/2020 CLINICAL DATA:  Headache EXAM: CT HEAD WITHOUT CONTRAST TECHNIQUE: Contiguous axial images were obtained from the base of the skull through the vertex without intravenous contrast. COMPARISON:  None. FINDINGS: Brain: Chronic small vessel disease throughout the deep white matter. No acute intracranial abnormality. Specifically, no hemorrhage, hydrocephalus, mass lesion, acute infarction, or significant intracranial injury. Vascular: No hyperdense vessel or unexpected calcification. Skull: No acute calvarial abnormality. Sinuses/Orbits: Visualized paranasal sinuses and mastoids clear. Orbital soft tissues unremarkable. Other: None IMPRESSION: Chronic small vessel disease throughout the deep white matter. No acute intracranial abnormality. Electronically Signed   By: Rolm Baptise M.D.   On: 06/27/2020 17:44    Procedures Procedures (including critical care time)  Medications Ordered in ED Medications  sodium chloride flush (NS) 0.9 % injection 3 mL (3 mLs Intravenous Not Given 06/27/20 1637)  alum & mag hydroxide-simeth (MAALOX/MYLANTA) 200-200-20 MG/5ML suspension 30 mL (30 mLs Oral Given 06/27/20 1700)  diazepam (VALIUM) tablet 5 mg (5 mg Oral Given 06/27/20 1832)    ED Course  I have reviewed the triage vital signs and the nursing notes.  Pertinent labs & imaging  results that were available during my care of the patient were reviewed by me and considered in my medical decision making (see chart for details).  Clinical Course as of Jun 27 2146  Mon Jun 27, 2020  2105 Troponin I (High Sensitivity): 3 [JS]    Clinical Course User Index [JS] Janeece Fitting, PA-C   MDM Rules/Calculators/A&P   Patient with a past medical history of hypertension, ongoing treatment with currently on her seventh blood pressure medication, without improvement since to the ED for evaluation of dizziness, chest pain, elevated BP.  According to her chart which have extensively reviewed, patient did call her PCP this morning, was advised to be seen in the ED.  Have reviewed her records extensively, she does have prior visits into the ED for blood pressure evaluation, sent in by PCP.  She is currently on valsartan 160 mg tablet, reports she only takes half of this as she gets side effects.  During evaluation patient is neurologically intact, upper and lower extremities with equal strength throughout, sensation is unremarkable.  No facial asymmetry noted, no dysarthria.  Vital signs are remarkable for hypertension with a systolic 361/443, did take her blood pressure medication this morning.  Describes the dizziness as the room spinning, feeling like she is in a daze.  Does not report any headache, no changes in her vision, neuro exam is unremarkable, I have a lower suspicion for CVA.  She also endorses pain along her chest, also describes this as "like to have indigestion like I need to burp ".  Does have a prior history of acid reflux, will attempt some Protonix at this time.  CT Head showed: Chronic small vessel disease throughout the deep white matter. No  acute intracranial abnormality.   X-ray of her chest did not show any pneumothorax, consolation, acute pathology.  Viewed of her labs by me, revealed a CBC with no leukocytosis.  BMP without any signs of kidney infarct.  Troponin x2  have been negative on today's visit.  She did have an episode of dizziness when ambulating to the bathroom, was assisted by nursing staff, given Valium 5 mg to treat for likely vertigo.  She has responded to medication, with improvement.  Blood pressure remains slightly elevated at 162/70 on her last read.  She does report improvement in her symptoms.  Husband would like additional therapy for blood pressure control.  Shared decision making conversation with patient and husband at the bedside.  We discussed management with the hypertension clinic, patient has been on prior medications and this is her eighth medication to help with symptoms.  I do not feel comfortable adding another therapy for patient as she is had reactions to most of the medications.  We did discuss going home on a short prescription of Valium to help with symptomatic control.  Patient and husband are agreeable with plan.  Return precautions discussed at length.  I have discussed patient with Dr. Sherry Ruffing who has evaluated patient and agrees with plan and management.   Portions of this note were generated with [employer]. Dictation errors may occur despite best attempts at proofreading.   Final Clinical Impression(s) / ED Diagnoses Final diagnoses:  Essential hypertension    Rx / DC Orders ED Discharge Orders         Ordered    diazepam (VALIUM) 5 MG tablet  Every 6 hours PRN     Discontinue  Reprint     06/27/20 2146           Janeece Fitting, PA-C 06/27/20 2147    Tegeler, Gwenyth Allegra, MD 06/28/20 [phone]

## 2020-07-05 ENCOUNTER — Telehealth: Payer: Self-pay | Admitting: General Practice

## 2020-07-05 NOTE — Telephone Encounter (Signed)
° ° °  Pt is calling to schedule ADV HTN with Dr. Oval Linsey per AVS from ED, forward info to [facility] for scheduling

## 2020-07-07 DIAGNOSIS — Q141 Congenital malformation of retina: Secondary | ICD-10-CM | POA: Diagnosis not present

## 2020-07-07 DIAGNOSIS — H04123 Dry eye syndrome of bilateral lacrimal glands: Secondary | ICD-10-CM | POA: Diagnosis not present

## 2020-07-07 DIAGNOSIS — H40043 Steroid responder, bilateral: Secondary | ICD-10-CM | POA: Diagnosis not present

## 2020-07-07 DIAGNOSIS — H401131 Primary open-angle glaucoma, bilateral, mild stage: Secondary | ICD-10-CM | POA: Diagnosis not present

## 2020-07-15 DIAGNOSIS — Z6838 Body mass index (BMI) 38.0-38.9, adult: Secondary | ICD-10-CM | POA: Diagnosis not present

## 2020-07-15 DIAGNOSIS — I10 Essential (primary) hypertension: Secondary | ICD-10-CM | POA: Diagnosis not present

## 2020-07-20 DIAGNOSIS — I10 Essential (primary) hypertension: Secondary | ICD-10-CM | POA: Diagnosis not present

## 2020-07-20 DIAGNOSIS — Z6838 Body mass index (BMI) 38.0-38.9, adult: Secondary | ICD-10-CM | POA: Diagnosis not present

## 2020-07-20 DIAGNOSIS — Z13220 Encounter for screening for lipoid disorders: Secondary | ICD-10-CM | POA: Diagnosis not present

## 2020-07-20 DIAGNOSIS — Z87891 Personal history of nicotine dependence: Secondary | ICD-10-CM | POA: Diagnosis not present

## 2020-07-20 DIAGNOSIS — Z79899 Other long term (current) drug therapy: Secondary | ICD-10-CM | POA: Diagnosis not present

## 2020-07-20 DIAGNOSIS — Z0000 Encounter for general adult medical examination without abnormal findings: Secondary | ICD-10-CM | POA: Diagnosis not present

## 2020-07-25 ENCOUNTER — Ambulatory Visit: Payer: BC Managed Care – PPO | Admitting: Cardiovascular Disease

## 2020-07-25 ENCOUNTER — Other Ambulatory Visit: Payer: Self-pay

## 2020-07-25 ENCOUNTER — Encounter: Payer: Self-pay | Admitting: Cardiovascular Disease

## 2020-07-25 VITALS — BP 142/82 | HR 70 | Ht 62.0 in | Wt 214.4 lb

## 2020-07-25 DIAGNOSIS — E669 Obesity, unspecified: Secondary | ICD-10-CM | POA: Diagnosis not present

## 2020-07-25 DIAGNOSIS — I10 Essential (primary) hypertension: Secondary | ICD-10-CM | POA: Diagnosis not present

## 2020-07-25 HISTORY — DX: Obesity, unspecified: E66.9

## 2020-07-25 HISTORY — DX: Essential (primary) hypertension: I10

## 2020-07-25 MED ORDER — DOXAZOSIN MESYLATE 2 MG PO TABS
2.0000 mg | ORAL_TABLET | Freq: Every day | ORAL | 1 refills | Status: DC
Start: 2020-07-25 — End: 2021-02-21

## 2020-07-25 NOTE — Patient Instructions (Addendum)
Medication Instructions:  DECREASE VALSARTAN TO 80 MG DAILY   START DOXAZOSIN 2 MG AT BEDTIME    Labwork: NONE    Testing/Procedures: Your physician has requested that you have a renal artery duplex. During this test, an ultrasound is used to evaluate blood flow to the kidneys. Allow one hour for this exam. Do not eat after midnight the day before and avoid carbonated beverages. Take your medications as you usually do.   Follow-Up: Your physician recommends that you schedule a follow-up appointment in: 1 MONTH WITH 08/30/2020 AT 1:30    You will receive a phone call from the PREP exercise and nutrition program to schedule an initial assessment.  Special Instructions:   MONITOR YOUR BLOOD PRESSURE TWICE A DAY, LOG IN BOOK PROVIDED. BRING BOOK AND BLOOD PRESSURE MACHINE TO FOLLOW UP  DASH Eating Plan DASH stands for "Dietary Approaches to Stop Hypertension." The DASH eating plan is a healthy eating plan that has been shown to reduce high blood pressure (hypertension). It may also reduce your risk for type 2 diabetes, heart disease, and stroke. The DASH eating plan may also help with weight loss. What are tips for following this plan?  General guidelines  Avoid eating more than 2,300 mg (milligrams) of salt (sodium) a day. If you have hypertension, you may need to reduce your sodium intake to 1,500 mg a day.  Limit alcohol intake to no more than 1 drink a day for nonpregnant women and 2 drinks a day for men. One drink equals 12 oz of beer, 5 oz of wine, or 1 oz of hard liquor.  Work with your health care provider to maintain a healthy body weight or to lose weight. Ask what an ideal weight is for you.  Get at least 30 minutes of exercise that causes your heart to beat faster (aerobic exercise) most days of the week. Activities may include walking, swimming, or biking.  Work with your health care provider or diet and nutrition specialist (dietitian) to adjust your eating plan to your  individual calorie needs. Reading food labels   Check food labels for the amount of sodium per serving. Choose foods with less than 5 percent of the Daily Value of sodium. Generally, foods with less than 300 mg of sodium per serving fit into this eating plan.  To find whole grains, look for the word "whole" as the first word in the ingredient list. Shopping  Buy products labeled as "low-sodium" or "no salt added."  Buy fresh foods. Avoid canned foods and premade or frozen meals. Cooking  Avoid adding salt when cooking. Use salt-free seasonings or herbs instead of table salt or sea salt. Check with your health care provider or pharmacist before using salt substitutes.  Do not fry foods. Cook foods using healthy methods such as baking, boiling, grilling, and broiling instead.  Cook with heart-healthy oils, such as olive, canola, soybean, or sunflower oil. Meal planning  Eat a balanced diet that includes: ? 5 or more servings of fruits and vegetables each day. At each meal, try to fill half of your plate with fruits and vegetables. ? Up to 6-8 servings of whole grains each day. ? Less than 6 oz of lean meat, poultry, or fish each day. A 3-oz serving of meat is about the same size as a deck of cards. One egg equals 1 oz. ? 2 servings of low-fat dairy each day. ? A serving of nuts, seeds, or beans 5 times each week. ? Heart-healthy fats.  Healthy fats called Omega-3 fatty acids are found in foods such as flaxseeds and coldwater fish, like sardines, salmon, and mackerel.  Limit how much you eat of the following: ? Canned or prepackaged foods. ? Food that is high in trans fat, such as fried foods. ? Food that is high in saturated fat, such as fatty meat. ? Sweets, desserts, sugary drinks, and other foods with added sugar. ? Full-fat dairy products.  Do not salt foods before eating.  Try to eat at least 2 vegetarian meals each week.  Eat more home-cooked food and less restaurant,  buffet, and fast food.  When eating at a restaurant, ask that your food be prepared with less salt or no salt, if possible. What foods are recommended? The items listed may not be a complete list. Talk with your dietitian about what dietary choices are best for you. Grains Whole-grain or whole-wheat bread. Whole-grain or whole-wheat pasta. Brown rice. [place]. Bulgur. Whole-grain and low-sodium cereals. Pita bread. Low-fat, low-sodium crackers. Whole-wheat flour tortillas. Vegetables Fresh or frozen vegetables (raw, steamed, roasted, or grilled). Low-sodium or reduced-sodium tomato and vegetable juice. Low-sodium or reduced-sodium tomato sauce and tomato paste. Low-sodium or reduced-sodium canned vegetables. Fruits All fresh, dried, or frozen fruit. Canned fruit in natural juice (without added sugar). Meat and other protein foods Skinless chicken or [place]. Ground chicken or [place]. Pork with fat trimmed off. Fish and seafood. Egg whites. Dried beans, peas, or lentils. Unsalted nuts, nut butters, and seeds. Unsalted canned beans. Lean cuts of beef with fat trimmed off. Low-sodium, lean deli meat. Dairy Low-fat (1%) or fat-free (skim) milk. Fat-free, low-fat, or reduced-fat cheeses. Nonfat, low-sodium ricotta or cottage cheese. Low-fat or nonfat yogurt. Low-fat, low-sodium cheese. Fats and oils Soft margarine without trans fats. Vegetable oil. Low-fat, reduced-fat, or light mayonnaise and salad dressings (reduced-sodium). Canola, safflower, olive, soybean, and sunflower oils. Avocado. Seasoning and other foods Herbs. Spices. Seasoning mixes without salt. Unsalted popcorn and pretzels. Fat-free sweets. What foods are not recommended? The items listed may not be a complete list. Talk with your dietitian about what dietary choices are best for you. Grains Baked goods made with fat, such as croissants, muffins, or some breads. Dry pasta or rice meal packs. Vegetables Creamed or fried  vegetables. Vegetables in a cheese sauce. Regular canned vegetables (not low-sodium or reduced-sodium). Regular canned tomato sauce and paste (not low-sodium or reduced-sodium). Regular tomato and vegetable juice (not low-sodium or reduced-sodium). Angie Fava. Olives. Fruits Canned fruit in a light or heavy syrup. Fried fruit. Fruit in cream or butter sauce. Meat and other protein foods Fatty cuts of meat. Ribs. Fried meat. Berniece Salines. Sausage. Bologna and other processed lunch meats. Salami. Fatback. Hotdogs. Bratwurst. Salted nuts and seeds. Canned beans with added salt. Canned or smoked fish. Whole eggs or egg yolks. Chicken or [place] with skin. Dairy Whole or 2% milk, cream, and half-and-half. Whole or full-fat cream cheese. Whole-fat or sweetened yogurt. Full-fat cheese. Nondairy creamers. Whipped toppings. Processed cheese and cheese spreads. Fats and oils Butter. Stick margarine. Lard. Shortening. Ghee. Bacon fat. Tropical oils, such as coconut, palm kernel, or palm oil. Seasoning and other foods Salted popcorn and pretzels. Onion salt, garlic salt, seasoned salt, table salt, and sea salt. Worcestershire sauce. Tartar sauce. Barbecue sauce. Teriyaki sauce. Soy sauce, including reduced-sodium. Steak sauce. Canned and packaged gravies. Fish sauce. Oyster sauce. Cocktail sauce. Horseradish that you find on the shelf. Ketchup. Mustard. Meat flavorings and tenderizers. Bouillon cubes. Hot sauce and Tabasco sauce. Premade or  packaged marinades. Premade or packaged taco seasonings. Relishes. Regular salad dressings. Where to find more information:  National Heart, Lung, and [place]: https://wilson-eaton.com/  American Heart Association: www.heart.org Summary  The DASH eating plan is a healthy eating plan that has been shown to reduce high blood pressure (hypertension). It may also reduce your risk for type 2 diabetes, heart disease, and stroke.  With the DASH eating plan, you should limit salt (sodium)  intake to 2,300 mg a day. If you have hypertension, you may need to reduce your sodium intake to 1,500 mg a day.  When on the DASH eating plan, aim to eat more fresh fruits and vegetables, whole grains, lean proteins, low-fat dairy, and heart-healthy fats.  Work with your health care provider or diet and nutrition specialist (dietitian) to adjust your eating plan to your individual calorie needs. This information is not intended to replace advice given to you by your health care provider. Make sure you discuss any questions you have with your health care provider. Document Released: 12/06/2011 Document Revised: 11/29/2017 Document Reviewed: 12/10/2016 Elsevier Patient Education  2020 [employer].

## 2020-07-25 NOTE — Progress Notes (Signed)
Hypertension Clinic Initial Assessment:    Date:  07/25/2020   ID:  Tonya Clarke, DOB 1961-10-23, MRN [id number]  PCP:  Cathlean Sauer, MD  Cardiologist:  No primary care provider on file.  Nephrologist:  Referring MD: Courtney Paris,*   CC: Hypertension  History of Present Illness:    Tonya Clarke is a 59 y.o. female with a hx of hypertension here to establish care in the hypertension clinic.   She was first diagnosed with hypertension in about 2018.  Since then her blood pressure has never really been well-controlled.  She has struggled with intolerances to multiple medications.  She tracks her BP at home.  It has been labile ranging from 107-167/80-100s.  She takes valsartan around 9 AM.  When she started taking it she got dizzy so she reduced to to 80mg . On day the dizziness was so bad that she had to sit still.  She went to the ED 6/28 with dizziness and hypertension.  She has struggled with intolerance to multiple medications.  She was most recently prescribed losartan 160 mg daily but only takes half due to side effects.  She presented to the ED and also had left-sided chest discomfort.  She had a head CT that revealed chronic microvascular disease but was otherwise unremarkable.  She was referred to advanced hypertension clinic for further evaluation.  Since then she saw her PCP 7/21 and given that her BP was still elevated she recommended that she take valsartan 80mg  bid.  She continues to have dizziness at least every other day and skin irritation.  She has limited eating out and salty food. She is trying to minimize the rice in her diet.  She is trying to transition to a plant based diet.  She mostly cooks fresh foods.  She tries to limit sodium intake.  She walks for 30 minutes daily.  She talks to a friend while walking and has no exertional dyspnea or chest pain.  She denies any lower extremity edema, orthopnea, or PND.  She snores rarely.  She previously had a  sleep study that was negative.  She drinks 1 or 2 cups of coffee daily.  She has alcohol rarely.  She denies NSAID use or other supplements other than apple cider vinegar.  She quit smoking in 1996.  Previous antihypertensives: Amlodipine- skin irritation HCTZ- skin burn Lisinopril- sinus drainage Losartan -rash Atenolol  Past Medical History:  Diagnosis Date  . Acid reflux   . Anemia   . Anxiety   . Chronic back pain   . Chronic SI joint pain   . Constipation   . Essential hypertension 07/25/2020  . Frequent urination   . Glaucoma   . HTN (hypertension)   . Infertility, female   . Leg edema   . No pertinent past medical history   . Obesity (BMI 30-39.9) 07/25/2020  . Right hip pain   . Right leg numbness     Past Surgical History:  Procedure Laterality Date  . BILATERAL SALPINGECTOMY  01/08/2013   Procedure: BILATERAL SALPINGECTOMY;  Surgeon: Marvene Staff, MD;  Location: [facility];  Service: Gynecology;  Laterality: Bilateral;  . CATARACT EXTRACTION    . CESAREAN SECTION    . DIAGNOSTIC LAPAROSCOPY  1980's  . DILATION AND CURETTAGE OF UTERUS  2011  . ROBOTIC ASSISTED TOTAL HYSTERECTOMY  01/08/2013   Procedure: ROBOTIC ASSISTED TOTAL HYSTERECTOMY;  Surgeon: Marvene Staff, MD;  Location: [facility];  Service: Gynecology;  Laterality: N/A;  3 hrs.  . WISDOM TOOTH EXTRACTION      Current Medications: Current Meds  Medication Sig  . Cyanocobalamin (VITAMIN B 12 PO) Take 1 capsule by mouth daily.  . cycloSPORINE, PF, (CEQUA) 0.09 % SOLN Apply to eye.  Marland Kitchen HYDROcodone-acetaminophen (NORCO/VICODIN) 5-325 MG tablet Take 1 tablet by mouth every 6 (six) hours as needed for moderate pain.  . Multiple Vitamin (MULTIVITAMIN WITH MINERALS) TABS tablet Take 1 tablet by mouth daily.  Marland Kitchen olopatadine (PATANOL) 0.1 % ophthalmic solution   . tiZANidine (ZANAFLEX) 4 MG capsule Take 4 mg by mouth 3 (three) times daily as needed for muscle spasms.  Marland Kitchen topiramate (TOPAMAX) 50 MG tablet Take  1 tablet (50 mg total) by mouth at bedtime.  . valsartan (DIOVAN) 160 MG tablet Take 160 mg by mouth as directed. TAKE 1/2 TABLET DAILY  . Vitamin D, Ergocalciferol, (DRISDOL) 1.25 MG (50000 UT) CAPS capsule Take 1 capsule (50,000 Units total) by mouth every 7 (seven) days.  . [DISCONTINUED] atenolol (TENORMIN) 25 MG tablet Take 25 mg by mouth daily.  . [DISCONTINUED] valsartan (DIOVAN) 160 MG tablet Take 160 mg by mouth daily.     Allergies:   Aspirin, Penicillins, Amlodipine, Hydrochlorothiazide, Lisinopril, Red dye, Sulfa antibiotics, Valtrex [valacyclovir], and Losartan   Social History   Socioeconomic History  . Marital status: Single    Spouse name: Not on file  . Number of children: 1  . Years of education: Not on file  . Highest education level: Not on file  Occupational History  . Occupation: Web designer  Tobacco Use  . Smoking status: Former Smoker    Packs/day: 2.50    Years: 20.00    Pack years: 50.00    Quit date: 01/01/1995    Years since quitting: 25.5  . Smokeless tobacco: Never Used  Vaping Use  . Vaping Use: Never used  Substance and Sexual Activity  . Alcohol use: Yes    Comment: occasional  . Drug use: No  . Sexual activity: Not on file  Other Topics Concern  . Not on file  Social History Narrative  . Not on file   Social Determinants of Health   Financial Resource Strain:   . Difficulty of Paying Living Expenses:   Food Insecurity:   . Worried About Charity fundraiser in the Last Year:   . Arboriculturist in the Last Year:   Transportation Needs:   . Film/video editor (Medical):   Marland Kitchen Lack of Transportation (Non-Medical):   Physical Activity:   . Days of Exercise per Week:   . Minutes of Exercise per Session:   Stress:   . Feeling of Stress :   Social Connections:   . Frequency of Communication with Friends and Family:   . Frequency of Social Gatherings with Friends and Family:   . Attends Religious Services:   . Active  Member of Clubs or Organizations:   . Attends Archivist Meetings:   Marland Kitchen Marital Status:      Family History: The patient's family history includes Breast cancer in her cousin; Cancer in her father and maternal aunt; Obesity in her mother.  ROS:   Please see the history of present illness.    All other systems reviewed and are negative.  EKGs/Labs/Other Studies Reviewed:    EKG:  EKG is not ordered today.  The ekg ordered 06/28/20 demonstrates sinus rhythm.  Rate 66 bpm.   Recent Labs: 06/27/2020: BUN 8; Creatinine, Ser 0.73;  Hemoglobin 13.5; Platelets 211; Potassium 4.2; Sodium 140   Recent Lipid Panel    Component Value Date/Time   CHOL 148 11/03/2018 1025   TRIG 75 11/03/2018 1025   HDL 72 11/03/2018 1025   LDLCALC 61 11/03/2018 1025    Physical Exam:    VS:  BP (!) 142/82   Pulse 70   Ht 5\' 2"  (1.575 m)   Wt (!) 214 lb 6.4 oz (97.3 kg)   LMP 12/19/2012   SpO2 99%   BMI 39.21 kg/m  , BMI Body mass index is 39.21 kg/m. GENERAL:  Well appearing HEENT: Pupils equal round and reactive, fundi not visualized, oral mucosa unremarkable NECK:  No jugular venous distention, waveform within normal limits, carotid upstroke brisk and symmetric, no bruits LUNGS:  Clear to auscultation bilaterally HEART:  RRR.  PMI not displaced or sustained,S1 and S2 within normal limits, no S3, no S4, no clicks, no rubs, no murmurs ABD:  Flat, positive bowel sounds normal in frequency in pitch, no bruits, no rebound, no guarding, no midline pulsatile mass, no hepatomegaly, no splenomegaly EXT:  2 plus pulses throughout, no edema, no cyanosis no clubbing SKIN:  No rashes no nodules NEURO:  Cranial nerves II through XII grossly intact, motor grossly intact throughout PSYCH:  Cognitively intact, oriented to person place and time   ASSESSMENT:    1. Essential hypertension   2. Obesity (BMI 30-39.9)     PLAN:    # Essential hypertension: # Obesity: Tonya Clarke has struggled to  control her blood pressure.  It remains quite labile.  We will get renal artery Dopplers to rule out renal artery stenosis.  She will start back taking losartan just 80 mg in the morning.  It does not seem to have made much difference since increasing the dose.  Add doxazosin 2 mg nightly.  Continue tracking blood pressure and limiting sodium intake.  She will work on increasing the intensity of her exercise.  She will track his blood pressure twice daily and understands that these trends will help [place] to adjust her medications as needed prior to his next appointment.  she is interested in enrolling in the PREP exercise and nutrition program through the [facility].     Secondary Causes of Hypertension  Medications/Herbal: OCP, steroids, stimulants, antidepressants, weight loss medication, immune suppressants, NSAIDs, sympathomimetics, alcohol, caffeine, licorice, ginseng, St. John's wort, chemo  Sleep Apnea- sleep study negative 2021 Renal artery stenosis- check renal artery Dopplers Hyperaldosteronism (testing not indicated) Hyper/hypothyroidism- thyroid normal 2019 Pheochromocytoma:  (testing not indicated) Cushing's syndrome: (consider if BP resistant to 3 simultaneous meds) Coarctation of the aorta  (testing not indicated)   Disposition:    FU with MD/PharmD in 1 month    Medication Adjustments/Labs and Tests Ordered: Current medicines are reviewed at length with the patient today.  Concerns regarding medicines are outlined above.  Orders Placed This Encounter  Procedures  . VAS US RENAL ARTERY DUPLEX   Meds ordered this encounter  Medications  . doxazosin (CARDURA) 2 MG tablet    Sig: Take 1 tablet (2 mg total) by mouth at bedtime.    Dispense:  90 tablet    Refill:  1     Signed, Skeet Latch, MD  07/25/2020 4:48 PM    [place]

## 2020-07-26 ENCOUNTER — Telehealth: Payer: Self-pay

## 2020-07-26 NOTE — Telephone Encounter (Signed)
VMT patient reference PREP Referral.  Request to call back to discuss.

## 2020-07-27 ENCOUNTER — Telehealth: Payer: Self-pay

## 2020-07-27 NOTE — Telephone Encounter (Signed)
Patient returned call  Explained PREP.  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

## 2020-08-03 DIAGNOSIS — R3129 Other microscopic hematuria: Secondary | ICD-10-CM | POA: Diagnosis not present

## 2020-08-03 DIAGNOSIS — I10 Essential (primary) hypertension: Secondary | ICD-10-CM | POA: Diagnosis not present

## 2020-08-03 DIAGNOSIS — E6609 Other obesity due to excess calories: Secondary | ICD-10-CM | POA: Diagnosis not present

## 2020-08-03 DIAGNOSIS — D3502 Benign neoplasm of left adrenal gland: Secondary | ICD-10-CM | POA: Diagnosis not present

## 2020-08-26 ENCOUNTER — Ambulatory Visit (HOSPITAL_COMMUNITY)
Admission: RE | Admit: 2020-08-26 | Payer: BC Managed Care – PPO | Source: Ambulatory Visit | Attending: Cardiovascular Disease | Admitting: Cardiovascular Disease

## 2020-08-30 ENCOUNTER — Other Ambulatory Visit: Payer: Self-pay

## 2020-08-30 ENCOUNTER — Ambulatory Visit (INDEPENDENT_AMBULATORY_CARE_PROVIDER_SITE_OTHER): Payer: BC Managed Care – PPO | Admitting: Pharmacist Clinician (PhC)/ Clinical Pharmacy Specialist

## 2020-08-30 DIAGNOSIS — I10 Essential (primary) hypertension: Secondary | ICD-10-CM | POA: Diagnosis not present

## 2020-08-30 MED ORDER — LISINOPRIL 10 MG PO TABS
10.0000 mg | ORAL_TABLET | Freq: Every day | ORAL | 3 refills | Status: DC
Start: 2020-08-30 — End: 2020-09-29

## 2020-08-30 NOTE — Patient Instructions (Signed)
If you have any problems or concerns about your BP, please call Jacob Cicero/Raquel at [phone]  Check your blood pressure at home daily and keep record of the readings.  Take your BP meds as follows:  Stop valsartan.  Start lisinopril 10 mg once daily in the mornings.   Continue with all other medications  Bring all of your meds, your BP cuff and your record of home blood pressures to your next appointment.  Exercise as you're able, try to walk approximately 30 minutes per day.  Keep salt intake to a minimum, especially watch canned and prepared boxed foods.  Eat more fresh fruits and vegetables and fewer canned items.  Avoid eating in fast food restaurants.    HOW TO TAKE YOUR BLOOD PRESSURE: . Rest 5 minutes before taking your blood pressure. .  Don't smoke or drink caffeinated beverages for at least 30 minutes before. . Take your blood pressure before (not after) you eat. . Sit comfortably with your back supported and both feet on the floor (don't cross your legs). . Elevate your arm to heart level on a table or a desk. . Use the proper sized cuff. It should fit smoothly and snugly around your bare upper arm. There should be enough room to slip a fingertip under the cuff. The bottom edge of the cuff should be 1 inch above the crease of the elbow. . Ideally, take 3 measurements at one sitting and record the average.

## 2020-08-30 NOTE — Assessment & Plan Note (Signed)
Patient with systolic/diastolic hypertension not currently well controlled.  She had recently cut back on valsartan from 160 mg to 80 mg daily, due to dizziness.  She notes the dizziness still problematic several days of the week.  Due to multiple sensitivities cannot use CCB, thiazides, or losartan.  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  Is willing to re-challenge.  Will have her discontinue valsartan and re-try lisinopril.  Will start at 10 mg daily, but suspect she will need to be up-titrated for good control.  She will return in one month for follow up.

## 2020-08-30 NOTE — Progress Notes (Signed)
08/30/2020 Tonya Clarke 1961/09/18 [id number]   HPI:  Tonya Clarke is a 59 y.o. female patient of Dr Oval Linsey, with a [place] below who presents today for hypertension clinic evaluation.  She was last seen by Dr. Oval Linsey in July with a pressure of 142/82.  She had previously been on valsartan 160 mg, then cut to 80 mg due to potential dizziness.  It was restarted at 80 mg and doxazosin 2 mg daily was added.    Patient states had no issues with BP until about 3 years ago.  She now believes that some of her elevated pressures were due to work stresses as well as sinus problems that came from working in a building with potential mold/standing water concerns.  No family history of hypertension.    Today she is in for follow up.  She continues to report problems with dizziness.  Describes it as a "vibrating" sensation that will usually occur in late morning or early afternoon, about 2-4 hours after taking her morning medications (valsartan 80 mg, MVI, B12, Vit D).  She also notes occasional esophageal pain/sensation of food sticking in her throat, when eating.    Blood Pressure Goal:  130/80  Current Medications: valsartan 80 mg (1/2 of 160 mg due to dizziness); doxazosin 2 mg  Family Hx: father and mother both living in their 87's, neither with any CV issues; multiple half siblings, no whole; one daughter 10 with no issues;    Social Hx: no tobacco, occasional social drink; 1-2 coffee daily   Diet: no sodas, 1/2+ gallon water daily (3-4 20 oz water/day); mostly home cooked meals, more plant based meals; does eat out on weekends; only occasional meats -chicken, fish; tofu and beans for protein;   Exercise: walk daily, at least 30 minutes (about 1-2 miles)  Home BP readings:  14 readings in past 2 weeks (117-154/82-98)  Intolerances: amlodipine - itching; hctz - itching/burning skin; losartan - rash; lisinopril listed as causing sinus drainage, but pt believes was due to old  building with mold/water issues  Labs:   Wt Readings from Last 3 Encounters:  08/30/20 214 lb 9.6 oz (97.3 kg)  07/25/20 (!) 214 lb 6.4 oz (97.3 kg)  06/27/20 215 lb (97.5 kg)   BP Readings from Last 3 Encounters:  08/30/20 (!) 142/88  07/25/20 (!) 142/82  06/27/20 (!) 163/90   Pulse Readings from Last 3 Encounters:  08/30/20 75  07/25/20 70  06/27/20 69    Current Outpatient Medications  Medication Sig Dispense Refill  . Cyanocobalamin (VITAMIN B 12 PO) Take 1 capsule by mouth daily.    . cycloSPORINE, PF, (CEQUA) 0.09 % SOLN Apply to eye.    Marland Kitchen doxazosin (CARDURA) 2 MG tablet Take 1 tablet (2 mg total) by mouth at bedtime. 90 tablet 1  . HYDROcodone-acetaminophen (NORCO/VICODIN) 5-325 MG tablet Take 1 tablet by mouth every 6 (six) hours as needed for moderate pain.    . Multiple Vitamin (MULTIVITAMIN WITH MINERALS) TABS tablet Take 1 tablet by mouth daily.    Marland Kitchen olopatadine (PATANOL) 0.1 % ophthalmic solution     . tiZANidine (ZANAFLEX) 4 MG capsule Take 4 mg by mouth 3 (three) times daily as needed for muscle spasms.    Marland Kitchen topiramate (TOPAMAX) 50 MG tablet Take 1 tablet (50 mg total) by mouth at bedtime. 30 tablet 0  . Vitamin D, Ergocalciferol, (DRISDOL) 1.25 MG (50000 UT) CAPS capsule Take 1 capsule (50,000 Units total) by mouth every 7 (  seven) days. 4 capsule 0  . lisinopril (ZESTRIL) 10 MG tablet Take 1 tablet (10 mg total) by mouth daily. 30 tablet 3   No current facility-administered medications for this visit.    Allergies  Allergen Reactions  . Aspirin Anaphylaxis  . Penicillins Anaphylaxis  . Amlodipine Itching    Makes skin crawl  . Hydrochlorothiazide Other (See Comments)    "makes skin burn"  . Lisinopril Other (See Comments)    Makes sinuses drain   . Red Dye     "makes my skin crawl"  . Sulfa Antibiotics Other (See Comments)    "makes my skin burn"  . Valtrex [Valacyclovir] Other (See Comments)    Joints swell  . Losartan Rash    Past Medical  History:  Diagnosis Date  . Acid reflux   . Anemia   . Anxiety   . Chronic back pain   . Chronic SI joint pain   . Constipation   . Essential hypertension 07/25/2020  . Frequent urination   . Glaucoma   . HTN (hypertension)   . Infertility, female   . Leg edema   . No pertinent past medical history   . Obesity (BMI 30-39.9) 07/25/2020  . Right hip pain   . Right leg numbness     Blood pressure (!) 142/88, pulse 75, temperature 98 F (36.7 C), height 5\' 2"  (1.575 m), weight 214 lb 9.6 oz (97.3 kg), last menstrual period 12/19/2012, SpO2 97 %.  Essential hypertension Patient with systolic/diastolic hypertension not currently well controlled.  She had recently cut back on valsartan from 160 mg to 80 mg daily, due to dizziness.  She notes the dizziness still problematic several days of the week.  Due to multiple sensitivities cannot use CCB, thiazides, or losartan.  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  Is willing to re-challenge.  Will have her discontinue valsartan and re-try lisinopril.  Will start at 10 mg daily, but suspect she will need to be up-titrated for good control.  She will return in one month for follow up.      Tommy Medal PharmD CPP [facility] [address] [place], [place] [address] [phone]

## 2020-09-15 ENCOUNTER — Other Ambulatory Visit: Payer: Self-pay

## 2020-09-15 ENCOUNTER — Ambulatory Visit (HOSPITAL_COMMUNITY)
Admission: RE | Admit: 2020-09-15 | Discharge: 2020-09-15 | Disposition: A | Discharge status: Home / Self Care | Payer: BC Managed Care – PPO | Source: Ambulatory Visit | Attending: Internal Medicine | Admitting: Internal Medicine

## 2020-09-15 DIAGNOSIS — I10 Essential (primary) hypertension: Secondary | ICD-10-CM | POA: Diagnosis not present

## 2020-09-29 ENCOUNTER — Ambulatory Visit (INDEPENDENT_AMBULATORY_CARE_PROVIDER_SITE_OTHER): Payer: BC Managed Care – PPO | Admitting: Pharmacist Clinician (PhC)/ Clinical Pharmacy Specialist

## 2020-09-29 ENCOUNTER — Other Ambulatory Visit: Payer: Self-pay

## 2020-09-29 VITALS — BP 142/80 | HR 71 | Resp 14 | Ht 62.0 in | Wt 214.0 lb

## 2020-09-29 DIAGNOSIS — I10 Essential (primary) hypertension: Secondary | ICD-10-CM | POA: Diagnosis not present

## 2020-09-29 MED ORDER — LISINOPRIL 20 MG PO TABS: 20.0000 mg | ORAL_TABLET | Freq: Every day | ORAL | 3 refills | Status: DC

## 2020-09-29 NOTE — Patient Instructions (Addendum)
Return for a a follow up appointment October 28 at 8 am  Go to the lab in 2 weeks to check kidney function  Check your blood pressure at home daily and keep record of the readings.  Take your BP meds as follows:  Increase the lisinopril to 20 mg each morning  Bring all of your meds, your BP cuff and your record of home blood pressures to your next appointment.  Exercise as you're able, try to walk approximately 30 minutes per day.  Keep salt intake to a minimum, especially watch canned and prepared boxed foods.  Eat more fresh fruits and vegetables and fewer canned items.  Avoid eating in fast food restaurants.    HOW TO TAKE YOUR BLOOD PRESSURE: . Rest 5 minutes before taking your blood pressure. .  Don't smoke or drink caffeinated beverages for at least 30 minutes before. . Take your blood pressure before (not after) you eat. . Sit comfortably with your back supported and both feet on the floor (don't cross your legs). . Elevate your arm to heart level on a table or a desk. . Use the proper sized cuff. It should fit smoothly and snugly around your bare upper arm. There should be enough room to slip a fingertip under the cuff. The bottom edge of the cuff should be 1 inch above the crease of the elbow. . Ideally, take 3 measurements at one sitting and record the average.

## 2020-09-29 NOTE — Progress Notes (Signed)
09/30/2020 Tonya Clarke August 24, 1961 [id number]   HPI:  Tonya Clarke is a 59 y.o. female patient of Dr Oval Linsey, with a [place] below who presents today for hypertension clinic evaluation.  Patient states had no issues with BP until about 3 years ago.  She now believes that some of her elevated pressures were due to work stresses as well as sinus problems that came from working in a building with potential mold/standing water concerns.  No family history of hypertension.  She was originanlly seen by Dr. Oval Linsey in July with a pressure of 142/82.  She had previously been on valsartan 160 mg, then cut due to potential dizziness.  It was restarted at 80 mg and doxazosin 2 mg daily was added.  We saw her a month later and her pressure was essentially unchanged.  She did not seem to tolerate doses of valsartan higher than 80 mg.  Chart noted past problems with lisinopril, however, patient believed it may have actually been a reaction to mold and standing water problems.  She was willing to re-challenge.    Today she is in for follow up.  No complaints.  Renal artery dopplers recently done, show no evidence of stenosis  Blood Pressure Goal:  130/80  Current Medications: lisinopril 10 mg qd doxazosin 2 mg  Family Hx: father and mother both living in their 82's, neither with any CV issues; multiple half siblings, no whole; one daughter 64 with no issues;    Social Hx: no tobacco, occasional social drink; 1-2 coffee daily   Diet: no sodas, 1/2+ gallon water daily (3-4 20 oz water/day); mostly home cooked meals, more plant based meals; does eat out on weekends; only occasional meats -chicken, fish; tofu and beans for protein;   Exercise: walk daily, at least 30 minutes (about 1-2 miles) PREP - will start in October  Home BP readings:  23 readings in past month Intolerances: amlodipine - itching; hctz - itching/burning skin; losartan - rash; lisinopril listed as causing sinus drainage, but pt  believes was due to old building with mold/water issues  Labs: 6/21:  Na 140, K 4.2, Glu 99, BUN 8, SCr 0.73  Wt Readings from Last 3 Encounters:  09/29/20 214 lb (97.1 kg)  08/30/20 214 lb 9.6 oz (97.3 kg)  07/25/20 (!) 214 lb 6.4 oz (97.3 kg)   BP Readings from Last 3 Encounters:  09/29/20 (!) 142/80  08/30/20 (!) 142/88  07/25/20 (!) 142/82   Pulse Readings from Last 3 Encounters:  09/29/20 71  08/30/20 75  07/25/20 70    Current Outpatient Medications  Medication Sig Dispense Refill  . Cyanocobalamin (VITAMIN B 12 PO) Take 1 capsule by mouth daily.    . cycloSPORINE, PF, (CEQUA) 0.09 % SOLN Apply to eye.    Marland Kitchen doxazosin (CARDURA) 2 MG tablet Take 1 tablet (2 mg total) by mouth at bedtime. 90 tablet 1  . HYDROcodone-acetaminophen (NORCO/VICODIN) 5-325 MG tablet Take 1 tablet by mouth every 6 (six) hours as needed for moderate pain.    . Multiple Vitamin (MULTIVITAMIN WITH MINERALS) TABS tablet Take 1 tablet by mouth daily.    Marland Kitchen olopatadine (PATANOL) 0.1 % ophthalmic solution     . tiZANidine (ZANAFLEX) 4 MG capsule Take 4 mg by mouth 3 (three) times daily as needed for muscle spasms.    Marland Kitchen topiramate (TOPAMAX) 50 MG tablet Take 1 tablet (50 mg total) by mouth at bedtime. 30 tablet 0  . Vitamin D, Ergocalciferol, (DRISDOL)  1.25 MG (50000 UT) CAPS capsule Take 1 capsule (50,000 Units total) by mouth every 7 (seven) days. 4 capsule 0  . lisinopril (ZESTRIL) 20 MG tablet Take 1 tablet (20 mg total) by mouth daily. 90 tablet 3   No current facility-administered medications for this visit.    Allergies  Allergen Reactions  . Aspirin Anaphylaxis  . Penicillins Anaphylaxis  . Amlodipine Itching    Makes skin crawl  . Hydrochlorothiazide Other (See Comments)    "makes skin burn"  . Lisinopril Other (See Comments)    Makes sinuses drain   . Red Dye     "makes my skin crawl"  . Sulfa Antibiotics Other (See Comments)    "makes my skin burn"  . Valtrex [Valacyclovir] Other (See  Comments)    Joints swell  . Losartan Rash    Past Medical History:  Diagnosis Date  . Acid reflux   . Anemia   . Anxiety   . Chronic back pain   . Chronic SI joint pain   . Constipation   . Essential hypertension 07/25/2020  . Frequent urination   . Glaucoma   . HTN (hypertension)   . Infertility, female   . Leg edema   . No pertinent past medical history   . Obesity (BMI 30-39.9) 07/25/2020  . Right hip pain   . Right leg numbness     Blood pressure (!) 142/80, pulse 71, resp. rate 14, height 5\' 2"  (1.575 m), weight 214 lb (97.1 kg), last menstrual period 12/19/2012, SpO2 97 %.  Essential hypertension Patient with systolic/diastolic hypertension, tolerating lisinopril well at this time.  BP not at goal so will increase lisinopril to 20 mg daily.  Metabolic panel drawn today.  She is to start PREP exercise this month and we will see her for 3rd PharmD visit in another month.     Tommy Medal PharmD CPP [facility] [address] [place], [place] [address] [phone]

## 2020-09-29 NOTE — Progress Notes (Deleted)
HPI:  Tonya Clarke is a 59 y.o. female patient of Dr Oval Linsey, with a [place] below who presents today for hypertension clinic follow up.  She was last seen by Dr. Oval Linsey in July with a pressure of 142/82.  She had previously been on valsartan 160 mg, then cut to 80 mg due to potential dizziness.  It was restarted at 80 mg and doxazosin 2 mg daily was added.    Patient states had no issues with BP until about 3 years ago.  She now believes that some of her elevated pressures were due to work stresses as well as sinus problems that came from working in a building with potential mold/standing water concerns.  No family history of hypertension.    She continues to report problems with dizziness.  Describes it as a "vibrating" sensation that will usually occur in late morning or early afternoon, about 2-4 hours after taking her morning medications (valsartan 80 mg, MVI, B12, Vit D).  She also notes occasional esophageal pain/sensation of food sticking in her throat, when eating.    Blood Pressure Goal:  130/80  Current Medications:  Lisinopril 10mg  daily  doxazosin 2 mg daily  Family Hx: father and mother both living in their 61's, neither with any CV issues; multiple half siblings, no whole; one daughter 44 with no issues;    Social Hx: no tobacco, occasional social drink; 1-2 coffee daily   Diet: no sodas, 1/2+ gallon water daily (3-4 20 oz water/day); mostly home cooked meals, more plant based meals; does eat out on weekends; only occasional meats -chicken, fish; tofu and beans for protein;   Exercise: walk daily, at least 30 minutes (about 1-2 miles)  Home BP readings:  14 readings in past 2 weeks (117-154/82-98)  Intolerances: amlodipine - itching; hctz - itching/burning skin; losartan - rash; lisinopril listed as causing sinus drainage, but pt believes was due to old building with mold/water issues  Labs:   Wt Readings from Last 3 Encounters:  08/30/20 214 lb 9.6 oz (97.3 kg)    07/25/20 (!) 214 lb 6.4 oz (97.3 kg)  06/27/20 215 lb (97.5 kg)   BP Readings from Last 3 Encounters:  08/30/20 (!) 142/88  07/25/20 (!) 142/82  06/27/20 (!) 163/90   Pulse Readings from Last 3 Encounters:  08/30/20 75  07/25/20 70  06/27/20 69    Current Outpatient Medications  Medication Sig Dispense Refill  . Cyanocobalamin (VITAMIN B 12 PO) Take 1 capsule by mouth daily.    . cycloSPORINE, PF, (CEQUA) 0.09 % SOLN Apply to eye.    Marland Kitchen doxazosin (CARDURA) 2 MG tablet Take 1 tablet (2 mg total) by mouth at bedtime. 90 tablet 1  . HYDROcodone-acetaminophen (NORCO/VICODIN) 5-325 MG tablet Take 1 tablet by mouth every 6 (six) hours as needed for moderate pain.    Marland Kitchen lisinopril (ZESTRIL) 10 MG tablet Take 1 tablet (10 mg total) by mouth daily. 30 tablet 3  . Multiple Vitamin (MULTIVITAMIN WITH MINERALS) TABS tablet Take 1 tablet by mouth daily.    Marland Kitchen olopatadine (PATANOL) 0.1 % ophthalmic solution     . tiZANidine (ZANAFLEX) 4 MG capsule Take 4 mg by mouth 3 (three) times daily as needed for muscle spasms.    Marland Kitchen topiramate (TOPAMAX) 50 MG tablet Take 1 tablet (50 mg total) by mouth at bedtime. 30 tablet 0  . Vitamin D, Ergocalciferol, (DRISDOL) 1.25 MG (50000 UT) CAPS capsule Take 1 capsule (50,000 Units total) by mouth every  7 (seven) days. 4 capsule 0   No current facility-administered medications for this visit.    Allergies  Allergen Reactions  . Aspirin Anaphylaxis  . Penicillins Anaphylaxis  . Amlodipine Itching    Makes skin crawl  . Hydrochlorothiazide Other (See Comments)    "makes skin burn"  . Lisinopril Other (See Comments)    Makes sinuses drain   . Red Dye     "makes my skin crawl"  . Sulfa Antibiotics Other (See Comments)    "makes my skin burn"  . Valtrex [Valacyclovir] Other (See Comments)    Joints swell  . Losartan Rash    Past Medical History:  Diagnosis Date  . Acid reflux   . Anemia   . Anxiety   . Chronic back pain   . Chronic SI joint pain    . Constipation   . Essential hypertension 07/25/2020  . Frequent urination   . Glaucoma   . HTN (hypertension)   . Infertility, female   . Leg edema   . No pertinent past medical history   . Obesity (BMI 30-39.9) 07/25/2020  . Right hip pain   . Right leg numbness     Last menstrual period 12/19/2012.  No problem-specific Assessment & Plan notes found for this encounter.   Simora Dingee Rodriguez-Guzman PharmD, BCPS, CPP [place] [address] 09/29/2020 3:21 PM

## 2020-09-30 ENCOUNTER — Encounter: Payer: Self-pay | Admitting: Pharmacist Clinician (PhC)/ Clinical Pharmacy Specialist

## 2020-09-30 NOTE — Assessment & Plan Note (Signed)
Patient with systolic/diastolic hypertension, tolerating lisinopril well at this time.  BP not at goal so will increase lisinopril to 20 mg daily.  Metabolic panel drawn today.  She is to start PREP exercise this month and we will see her for 3rd PharmD visit in another month.

## 2020-10-13 DIAGNOSIS — I10 Essential (primary) hypertension: Secondary | ICD-10-CM | POA: Diagnosis not present

## 2020-10-13 DIAGNOSIS — Z90710 Acquired absence of both cervix and uterus: Secondary | ICD-10-CM | POA: Diagnosis not present

## 2020-10-13 DIAGNOSIS — Z01419 Encounter for gynecological examination (general) (routine) without abnormal findings: Secondary | ICD-10-CM | POA: Diagnosis not present

## 2020-10-14 ENCOUNTER — Telehealth: Payer: Self-pay

## 2020-10-14 DIAGNOSIS — I10 Essential (primary) hypertension: Secondary | ICD-10-CM | POA: Diagnosis not present

## 2020-10-14 NOTE — Telephone Encounter (Signed)
LVMT requesting call back reference next PREP class starting on NOV 2nd.Edwyna Perfect [id number] at [facility]

## 2020-10-15 LAB — BASIC METABOLIC PANEL
BUN/Creatinine Ratio: 9 (ref 9–23)
BUN: 7 mg/dL (ref 6–24)
CO2: 25 mmol/L (ref 20–29)
Calcium: 9.2 mg/dL (ref 8.7–10.2)
Chloride: 103 mmol/L (ref 96–106)
Creatinine, Ser: 0.81 mg/dL (ref 0.57–1.00)
GFR calc Af Amer: 93 mL/min/{1.73_m2} (ref >59)
GFR calc non Af Amer: 81 mL/min/{1.73_m2} (ref >59)
Glucose: 85 mg/dL (ref 65–99)
Potassium: 4.2 mmol/L (ref 3.5–5.2)
Sodium: 141 mmol/L (ref 134–144)

## 2020-10-18 NOTE — Progress Notes (Signed)
PREP Intake scheduled for 10/27/20 at 7pm for class start 11/2 at [place] [id number]

## 2020-10-27 ENCOUNTER — Ambulatory Visit (INDEPENDENT_AMBULATORY_CARE_PROVIDER_SITE_OTHER): Payer: BC Managed Care – PPO | Admitting: Pharmacist Clinician (PhC)/ Clinical Pharmacy Specialist

## 2020-10-27 ENCOUNTER — Other Ambulatory Visit: Payer: Self-pay

## 2020-10-27 VITALS — BP 132/84 | HR 76 | Resp 16 | Ht 62.0 in | Wt 211.0 lb

## 2020-10-27 DIAGNOSIS — I10 Essential (primary) hypertension: Secondary | ICD-10-CM

## 2020-10-27 MED ORDER — SPIRONOLACTONE 25 MG PO TABS
25.0000 mg | ORAL_TABLET | Freq: Every day | ORAL | 1 refills | Status: DC
Start: 2020-10-27 — End: 2020-11-22

## 2020-10-27 NOTE — Assessment & Plan Note (Addendum)
Patient with essential hypertension, doing better with lisinopril 20 mg, however it has unfortunately caused her to develop a persistent cough.  Will stop this today and have her start spironolactone 25 mg once daily in the mornings.  She will need to repeat labs in 2 weeks and was given paperwork to get that done.  She should continue with doxazosin, as well as regular home BP monitoring.  Diastolic pressure is still mostly elevated, lowest reading in the past month was 80.   She has now been seen in the CVRR clinic 3 times, so will forward a note to [place] to schedule follow up with Dr. Oval Linsey.

## 2020-10-27 NOTE — Progress Notes (Signed)
10/27/2020 JILLIAM BELLMORE Sep 16, 1961 [id number]   HPI:  Tonya Clarke is a 59 y.o. female patient of Dr Oval Linsey, with a [place] below who presents today for hypertension clinic evaluation.  Patient states had no issues with BP until about 3 years ago.  She now believes that some of her elevated pressures were due to work stresses as well as sinus problems that came from working in a building with potential mold/standing water concerns.  No family history of hypertension.  She was originanlly seen by Dr. Oval Linsey in July with a pressure of 142/82.  She had previously been on valsartan 160 mg, then cut due to potential dizziness.  It was restarted at 80 mg and doxazosin 2 mg daily was added.  We saw her a month later and her pressure was essentially unchanged.  She did not seem to tolerate doses of valsartan higher than 80 mg.  Chart noted past problems with lisinopril, however, patient believed it may have actually been a reaction to mold and standing water problems.  She was re-started on lisinopril 10 mg and later increased to 20 mg.  Renal artery dopplers done in the recent past show no evidence of stenosis.    She returns today for follow up. Unfortunately, she developed a cough about 2 weeks ago, most days it starts mid-day and continues thru the night, disrupting her sleep.  Otherwise, she has no complaints today.  She also recently started a weight loss powder (mixed in water) called Slimmer Max.  She notes that it does a good job of decreasing her appetite and she is down 4 pounds from a high weight of 215 back in June.  We also noted that while most of her home BP readings were improved, she did have one morning that peaked at 174/102.  She believes she forgot meds the day before, and also had eaten dinner (night before) at a [employer].  It took about 2-3 days for her pressure to drop back to goal levels.    Blood Pressure Goal:  130/80  Current Medications: lisinopril 20  mg qd doxazosin 2 mg  Family Hx: father and mother both living in their 71's, neither with any CV issues; multiple half siblings, no whole; one daughter 60 with no issues;    Social Hx: no tobacco, occasional social drink; 1-2 coffee daily   Diet: no sodas, 1/2+ gallon water daily (3-4 20 oz water/day); mostly home cooked meals, more plant based meals; does eat out on weekends; only occasional meats -chicken, fish; tofu and beans for protein;   Exercise: walk daily, at least 30 minutes (about 1-2 miles) PREP - will start in October ; assessment tonight, first class Nov 2  Home BP readings:  16 am readings average 130/87 (126/86 if we don't count the 174/102 on day after missed dose/eaing out).  14 PM readings average 135/89  Intolerances: amlodipine - itching; hctz - itching/burning skin; losartan - rash; lisinopril  - cough  Labs: 6/21:  Na 140, K 4.2, Glu 99, BUN 8, SCr 0.73  10/21: Na 141, K 4.2, Glu 85, BUN 7, SCr 0.81  Wt Readings from Last 3 Encounters:  10/27/20 211 lb (95.7 kg)  09/29/20 214 lb (97.1 kg)  08/30/20 214 lb 9.6 oz (97.3 kg)   BP Readings from Last 3 Encounters:  10/27/20 132/84  09/29/20 (!) 142/80  08/30/20 (!) 142/88   Pulse Readings from Last 3 Encounters:  10/27/20 76  09/29/20 71  08/30/20 75    Current Outpatient Medications  Medication Sig Dispense Refill   Cyanocobalamin (VITAMIN B 12 PO) Take 1 capsule by mouth daily.     cycloSPORINE, PF, (CEQUA) 0.09 % SOLN Apply to eye.     doxazosin (CARDURA) 2 MG tablet Take 1 tablet (2 mg total) by mouth at bedtime. 90 tablet 1   HYDROcodone-acetaminophen (NORCO/VICODIN) 5-325 MG tablet Take 1 tablet by mouth every 6 (six) hours as needed for moderate pain.     Multiple Vitamin (MULTIVITAMIN WITH MINERALS) TABS tablet Take 1 tablet by mouth daily.     olopatadine (PATANOL) 0.1 % ophthalmic solution      tiZANidine (ZANAFLEX) 4 MG capsule Take 4 mg by mouth 3 (three) times daily as needed for muscle  spasms.     topiramate (TOPAMAX) 50 MG tablet Take 1 tablet (50 mg total) by mouth at bedtime. 30 tablet 0   Vitamin D, Ergocalciferol, (DRISDOL) 1.25 MG (50000 UT) CAPS capsule Take 1 capsule (50,000 Units total) by mouth every 7 (seven) days. 4 capsule 0   spironolactone (ALDACTONE) 25 MG tablet Take 1 tablet (25 mg total) by mouth daily. 30 tablet 1   No current facility-administered medications for this visit.    Allergies  Allergen Reactions   Aspirin Anaphylaxis   Penicillins Anaphylaxis   Amlodipine Itching    Makes skin crawl   Hydrochlorothiazide Other (See Comments)    "makes skin burn"   Lisinopril Cough        Red Dye     "makes my skin crawl"   Sulfa Antibiotics Other (See Comments)    "makes my skin burn"   Valtrex [Valacyclovir] Other (See Comments)    Joints swell   Losartan Rash    Past Medical History:  Diagnosis Date   Acid reflux    Anemia    Anxiety    Chronic back pain    Chronic SI joint pain    Constipation    Essential hypertension 07/25/2020   Frequent urination    Glaucoma    HTN (hypertension)    Infertility, female    Leg edema    No pertinent past medical history    Obesity (BMI 30-39.9) 07/25/2020   Right hip pain    Right leg numbness     Blood pressure 132/84, pulse 76, resp. rate 16, height 5\' 2"  (1.575 m), weight 211 lb (95.7 kg), last menstrual period 12/19/2012, SpO2 97 %.  Essential hypertension Patient with essential hypertension, doing better with lisinopril 20 mg, however it has unfortunately caused her to develop a persistent cough.  Will stop this today and have her start spironolactone 25 mg once daily in the mornings.  She will need to repeat labs in 2 weeks and was given paperwork to get that done.  She should continue with doxazosin, as well as regular home BP monitoring.  Diastolic pressure is still mostly elevated, lowest reading in the past month was 80.   She has now been seen in the CVRR  clinic 3 times, so will forward a note to [place] to schedule follow up with Dr. Oval Linsey.     Tommy Medal PharmD CPP [facility] [address] [place], [place] [address] [phone]

## 2020-10-27 NOTE — Patient Instructions (Signed)
Dr. Blenda Mounts nurse Rip Harbour will call to schedule follow up appointment for late November/early December  Go to the lab in 2 weeks to check kidney function and potassium levels  Check your blood pressure at home once or twice daily and keep record of the readings.  Take your BP meds as follows:  Stop lisinopril - may take 1-2 weeks for cough to completely go away  Start spironolactone 25 mg once daily in the mornings  Continue with doxazosin 2 mg at bedtime  Bring all of your meds, your BP cuff and your record of home blood pressures to your next appointment.  Exercise as youre able, try to walk approximately 30 minutes per day.  Keep salt intake to a minimum, especially watch canned and prepared boxed foods.  Eat more fresh fruits and vegetables and fewer canned items.  Avoid eating in fast food restaurants.    HOW TO TAKE YOUR BLOOD PRESSURE:  Rest 5 minutes before taking your blood pressure.   Dont smoke or drink caffeinated beverages for at least 30 minutes before.  Take your blood pressure before (not after) you eat.  Sit comfortably with your back supported and both feet on the floor (dont cross your legs).  Elevate your arm to heart level on a table or a desk.  Use the proper sized cuff. It should fit smoothly and snugly around your bare upper arm. There should be enough room to slip a fingertip under the cuff. The bottom edge of the cuff should be 1 inch above the crease of the elbow.  Ideally, take 3 measurements at one sitting and record the average.

## 2020-11-01 NOTE — Progress Notes (Signed)
[facility] Report   Patient Details  Name: Tonya Clarke MRN: [id number] Date of Birth: [birth year] Age: 59 y.o. PCP: Cathlean Sauer, MD Intake on 10/31/20  Vitals:   10/31/20 1830  Pulse: 74  SpO2: 97%  Weight: 208 lb 12.8 oz (94.7 kg)  Height: 5\' 2"  (1.575 m)      Past Medical History:  Diagnosis Date  . Acid reflux   . Anemia   . Anxiety   . Chronic back pain   . Chronic SI joint pain   . Constipation   . Essential hypertension 07/25/2020  . Frequent urination   . Glaucoma   . HTN (hypertension)   . Infertility, female   . Leg edema   . No pertinent past medical history   . Obesity (BMI 30-39.9) 07/25/2020  . Right hip pain   . Right leg numbness    Past Surgical History:  Procedure Laterality Date  . BILATERAL SALPINGECTOMY  01/08/2013   Procedure: BILATERAL SALPINGECTOMY;  Surgeon: Marvene Staff, MD;  Location: [facility];  Service: Gynecology;  Laterality: Bilateral;  . CATARACT EXTRACTION    . CESAREAN SECTION    . DIAGNOSTIC LAPAROSCOPY  1980's  . DILATION AND CURETTAGE OF UTERUS  2011  . ROBOTIC ASSISTED TOTAL HYSTERECTOMY  01/08/2013   Procedure: ROBOTIC ASSISTED TOTAL HYSTERECTOMY;  Surgeon: Marvene Staff, MD;  Location: [facility];  Service: Gynecology;  Laterality: N/A;  3 hrs.  . WISDOM TOOTH EXTRACTION     Social History   Tobacco Use  Smoking Status Former Smoker  . Packs/day: 2.50  . Years: 20.00  . Pack years: 50.00  . Quit date: 01/01/1995  . Years since quitting: 25.8  Smokeless Tobacco Never Used     Barnett Hatter 11/01/2020, 11:48 AM

## 2020-11-09 NOTE — Progress Notes (Signed)
[facility] PREP Weekly Session   Patient Details  Name: Tonya Clarke MRN: [id number] Date of Birth: 1961/07/04 Age: 59 y.o. PCP: Cathlean Sauer, MD  Vitals:   11/08/20 1815  BP: 139/82     [facility] Weekly seesion - 11/09/20 1000      Weekly Session   Topic Discussed Importance of resistance training;Other ways to be active    Minutes exercised this week 90 minutes    Classes attended to date 3          Fun things since last meeting: shopping Grateful for: coming to class Nutrition celebration: [place] Barriers/struggles: snacking on the wrong kind of foods    Barnett Hatter 11/09/2020, 10:35 AM

## 2020-11-11 DIAGNOSIS — I10 Essential (primary) hypertension: Secondary | ICD-10-CM | POA: Diagnosis not present

## 2020-11-12 LAB — BASIC METABOLIC PANEL
BUN/Creatinine Ratio: 13 (ref 9–23)
BUN: 11 mg/dL (ref 6–24)
CO2: 26 mmol/L (ref 20–29)
Calcium: 9.9 mg/dL (ref 8.7–10.2)
Chloride: 103 mmol/L (ref 96–106)
Creatinine, Ser: 0.86 mg/dL (ref 0.57–1.00)
GFR calc Af Amer: 87 mL/min/{1.73_m2} (ref >59)
GFR calc non Af Amer: 75 mL/min/{1.73_m2} (ref >59)
Glucose: 100 mg/dL — ABNORMAL HIGH (ref 65–99)
Potassium: 4.4 mmol/L (ref 3.5–5.2)
Sodium: 142 mmol/L (ref 134–144)

## 2020-11-16 NOTE — Progress Notes (Signed)
[facility] PREP Weekly Session   Patient Details  Name: Tonya Clarke MRN: [id number] Date of Birth: Apr 30, 1961 Age: 59 y.o. PCP: Cathlean Sauer, MD  Vitals:   11/16/20 1034  Weight: 209 lb (94.8 kg)     [facility] Weekly seesion - 11/16/20 1000      Weekly Session   Topic Discussed Eating for the season;Healthy eating tips;Water    Minutes exercised this week 215 minutes    Classes attended to date 5          PREP class 11/15/20 at [place] things since last meeting: shop xmas Grateful for: my job, family, and friends Nutrition celebration: meal prep Barriers/struggles: logging what I eat   Barnett Hatter 11/16/2020, 10:36 AM

## 2020-11-22 ENCOUNTER — Other Ambulatory Visit: Payer: Self-pay | Admitting: Cardiovascular Disease

## 2020-11-23 NOTE — Progress Notes (Signed)
Addendum: topic discussed correction: healthy habits

## 2020-11-23 NOTE — Progress Notes (Signed)
[facility] PREP Weekly Session   Patient Details  Name: Tonya Clarke MRN: [id number] Date of Birth: 06/25/1961 Age: 59 y.o. PCP: Cathlean Sauer, MD  Vitals:   11/23/20 1106  Weight: 203 lb (92.1 kg)     [facility] Weekly seesion - 11/23/20 1100      Weekly Session   Topic Discussed Eating for the season   circuit taught   Minutes exercised this week 240 minutes    Classes attended to date 7          Grateful for: good weather Nutrition celebration: pumpkin nut muffin   [name] 11/23/2020, 11:07 AM

## 2020-11-30 NOTE — Progress Notes (Signed)
[facility] PREP Weekly Session   Patient Details  Name: Tonya Clarke MRN: [id number] Date of Birth: October 06, 1961 Age: 59 y.o. PCP: Cathlean Sauer, MD  Vitals:   11/30/20 1034  Weight: 209 lb (94.8 kg)     [facility] Weekly seesion - 11/30/20 1000      Weekly Session   Topic Discussed Restaurant Eating   salt and sugar demo   Minutes exercised this week 300 minutes    Classes attended to date 8          @[facility]  [facility] Fun things you did since last meeting: sleep Grateful for: coming to class Barriers/struggles: weight loss   Barnett Hatter 11/30/2020, 10:36 AM

## 2020-12-07 NOTE — Progress Notes (Signed)
[facility] PREP Weekly Session   Patient Details  Name: OMMIE DEGEORGE MRN: [id number] Date of Birth: 25-Oct-1961 Age: 59 y.o. PCP: Cathlean Sauer, MD  There were no vitals filed for this visit.   [facility] Weekly seesion - 12/07/20 1000      Weekly Session   Topic Discussed Stress management and problem solving    Minutes exercised this week 490 minutes   add in more flexibility -yoga example, decrease cardio time    Classes attended to date 10         PREP cass at [facility] 12/06/20 late entry  Fun things since last meeting: went to a birthday party Grateful for: family, friends, coworkers Nutrition celebration: [employer] [place] [employer]  Barriers/struggles: journaling my meals   [name] 12/07/2020, 10:45 AM

## 2020-12-14 NOTE — Progress Notes (Signed)
[facility] PREP Weekly Session   Patient Details  Name: Tonya Clarke MRN: [id number] Date of Birth: [birth year] Age: 59 y.o. PCP: Cathlean Sauer, MD  There were no vitals filed for this visit.   [facility] Weekly seesion - 12/14/20 1000      Weekly Session   Topic Discussed Expectations and non-scale victories    Minutes exercised this week 240 minutes    Classes attended to date 44          PREP class 12/13/20 at [place] things since last meeting: watch movies with family Grateful for: being able to exercise regularly Barriers/struggles: journaling  Barnett Hatter 12/14/2020, 10:46 AM

## 2020-12-21 NOTE — Progress Notes (Signed)
[facility] PREP Weekly Session   Patient Details  Name: ITALIA WOLFERT MRN: [id number] Date of Birth: 03-04-1961 Age: 59 y.o. PCP: Cathlean Sauer, MD  Vitals:   12/21/20 0950  Weight: 206 lb (93.4 kg)     [facility] Weekly seesion - 12/21/20 0900      Weekly Session   Topic Discussed Other   Portion control   Minutes exercised this week 180 minutes    Classes attended to date 25          PREP class at [facility] on 12/20/20 Fun things since last meeting: watch movies Grateful for: to wake up with less back pain lately Barriers/struggles: logging meals  Discussed increasing weight training to induce more metabolic burn at rest Cardio at 150 min Interested in talking with dietician to know exactly what to eat. Was going to [facility] prior to covid.  Asked pt to reach out to MD to facilitate referral Talked about ways to stay healthy and decreasing flu/covid exposure. Was previously on Vit D supplement. Doesn't know level. Asked she be sure to follow up with MD to ensure adequate level.    Barnett Hatter 12/21/2020, 9:51 AM

## 2021-01-02 ENCOUNTER — Encounter: Payer: Self-pay | Admitting: Cardiovascular Disease

## 2021-01-02 ENCOUNTER — Telehealth (INDEPENDENT_AMBULATORY_CARE_PROVIDER_SITE_OTHER): Payer: BC Managed Care – PPO | Admitting: Cardiovascular Disease

## 2021-01-02 VITALS — BP 124/70 | Ht 62.0 in | Wt 208.0 lb

## 2021-01-02 DIAGNOSIS — E669 Obesity, unspecified: Secondary | ICD-10-CM | POA: Diagnosis not present

## 2021-01-02 DIAGNOSIS — I10 Essential (primary) hypertension: Secondary | ICD-10-CM

## 2021-01-02 NOTE — Patient Instructions (Addendum)
Medication Instructions:  Your physician recommends that you continue on your current medications as directed. Please refer to the Current Medication list given to you today.  Labwork: NONE  Testing/Procedures: NONE  Follow-Up: 06/12/2021 AT 3:30 PM DR Minto IN PERSON, ADV HTN CLINIC   You have been referred to    Where: [facility] Address: [address] [place] [address] Phone: [phone] Expires: 01/02/2022 (requested)  IF YOU DO  NOT HEAR FROM THE OFFICE IN 2 WEEKS CALL THEM DIRECTLY AT THE NUMBER LISTED ABOVE

## 2021-01-02 NOTE — Progress Notes (Signed)
Virtual Visit via Video Note   This visit type was conducted due to national recommendations for restrictions regarding the COVID-19 Pandemic (e.g. social distancing) in an effort to limit this patient's exposure and mitigate transmission in our community.  Due to her co-morbid illnesses, this patient is at least at moderate risk for complications without adequate follow up.  This format is felt to be most appropriate for this patient at this time.  All issues noted in this document were discussed and addressed.  A limited physical exam was performed with this format.  Please refer to the patient's chart for her consent to telehealth for [facility].  The patient was identified using 2 identifiers.  Date:  01/02/2021   ID:  Tonya Clarke, DOB Aug 23, 1961, MRN [id number]  Patient Location: Home Provider Location: Office/Clinic  PCP:  Cathlean Sauer, MD  Cardiologist:  No primary care provider on file.  Electrophysiologist:  None   Evaluation Performed:  Follow-Up Visit  Chief Complaint:  hypertension  The patient does not have symptoms concerning for COVID-19 infection (fever, chills, cough, or new shortness of breath).    Hypertension Clinic Follow Up:    Date:  01/02/2021   ID:  Tonya Clarke, DOB Dec 08, 1961, MRN [id number]  PCP:  Cathlean Sauer, MD  Cardiologist:  No primary care provider on file.  Nephrologist:  Referring MD: Cathlean Sauer, MD   CC: Hypertension  History of Present Illness:    Tonya Clarke is a 60 y.o. female with a hx of hypertension and prior tobacco abuse here for follow-up.  She was first diagnosed with hypertension in about 2018.  Since then her blood pressure has never really been well-controlled.  She has struggled with intolerances to multiple medications.  She tracks her BP at home.  It has been labile ranging from 107-167/80-100s. On initial assessment she was taking valsartan around 9 AM.  When she started taking it she got dizzy so  she reduced to to 80mg . One day the dizziness was so bad that she had to sit still.  She went to the ED 6/28 with dizziness and hypertension.  She has struggled with intolerance to multiple medications.  She was most recently prescribed losartan 160 mg daily but only takes half due to side effects.  She presented to the ED and also had left-sided chest discomfort.  She had a head CT that revealed chronic microvascular disease but was otherwise unremarkable.  She was referred to advanced hypertension clinic for further evaluation.  Since then she saw her PCP 7/21 and given that her BP was still elevated she recommended that she take valsartan 80mg  bid.  She continues to have dizziness at least every other day and skin irritation.   At her initial appointment doxazosin was added and valsartan was reduced to 80 mg. She followed up with our pharmacist and valsartan was switched back to lisinopril.  She last our pharmacist on 09/2020. Her blood pressure is doing better, however she had a cough on lisinopril so she was switched to spironolactone.  She has been feeling well.  Her BP has been 120s/70s lately.  She has been participating in the PREP program through the [facility].  She has been enjoying the program and feels like she is making some progress.  She has been losing weight and inches.  She mostly walks for exercise and feels good with the exercise.  She has no exertional chest pain or shortness of breath.  She would like a referral  to a weight loss program.  Previous antihypertensives: Amlodipine- skin irritation HCTZ- skin burn Lisinopril- sinus drainage Losartan -rash Atenolol Valsartan-dizziness  Past Medical History:  Diagnosis Date  . Acid reflux   . Anemia   . Anxiety   . Chronic back pain   . Chronic SI joint pain   . Constipation   . Essential hypertension 07/25/2020  . Frequent urination   . Glaucoma   . HTN (hypertension)   . Infertility, female   . Leg edema   . No pertinent past  medical history   . Obesity (BMI 30-39.9) 07/25/2020  . Right hip pain   . Right leg numbness     Past Surgical History:  Procedure Laterality Date  . BILATERAL SALPINGECTOMY  01/08/2013   Procedure: BILATERAL SALPINGECTOMY;  Surgeon: Marvene Staff, MD;  Location: [facility];  Service: Gynecology;  Laterality: Bilateral;  . CATARACT EXTRACTION    . CESAREAN SECTION    . DIAGNOSTIC LAPAROSCOPY  1980's  . DILATION AND CURETTAGE OF UTERUS  2011  . ROBOTIC ASSISTED TOTAL HYSTERECTOMY  01/08/2013   Procedure: ROBOTIC ASSISTED TOTAL HYSTERECTOMY;  Surgeon: Marvene Staff, MD;  Location: [facility];  Service: Gynecology;  Laterality: N/A;  3 hrs.  . WISDOM TOOTH EXTRACTION      Current Medications: Current Meds  Medication Sig  . Cyanocobalamin (VITAMIN B 12 PO) Take 1 capsule by mouth daily.  Marland Kitchen doxazosin (CARDURA) 2 MG tablet Take 1 tablet (2 mg total) by mouth at bedtime.  Marland Kitchen HYDROcodone-acetaminophen (NORCO/VICODIN) 5-325 MG tablet Take 1 tablet by mouth every 6 (six) hours as needed for moderate pain.  . Multiple Vitamin (MULTIVITAMIN WITH MINERALS) TABS tablet Take 1 tablet by mouth daily.  Marland Kitchen olopatadine (PATANOL) 0.1 % ophthalmic solution Place into both eyes 2 (two) times daily.  Marland Kitchen spironolactone (ALDACTONE) 25 MG tablet TAKE ONE (1) TABLET BY MOUTH EVERY DAY  . tiZANidine (ZANAFLEX) 4 MG capsule Take 4 mg by mouth 3 (three) times daily as needed for muscle spasms.  Marland Kitchen topiramate (TOPAMAX) 50 MG tablet Take 1 tablet (50 mg total) by mouth at bedtime.     Allergies:   Aspirin, Penicillins, Amlodipine, Hydrochlorothiazide, Lisinopril, Red dye, Sulfa antibiotics, Valtrex [valacyclovir], and Losartan   Social History   Socioeconomic History  . Marital status: Single    Spouse name: Not on file  . Number of children: 1  . Years of education: Not on file  . Highest education level: Not on file  Occupational History  . Occupation: Web designer  Tobacco Use  . Smoking  status: Former Smoker    Packs/day: 2.50    Years: 20.00    Pack years: 50.00    Quit date: 01/01/1995    Years since quitting: 26.0  . Smokeless tobacco: Never Used  Vaping Use  . Vaping Use: Never used  Substance and Sexual Activity  . Alcohol use: Yes    Comment: occasional  . Drug use: No  . Sexual activity: Not on file  Other Topics Concern  . Not on file  Social History Narrative  . Not on file   Social Determinants of Health   Financial Resource Strain: Not on file  Food Insecurity: Not on file  Transportation Needs: Not on file  Physical Activity: Not on file  Stress: Not on file  Social Connections: Not on file     Family History: The patient's family history includes Breast cancer in her cousin; Cancer in her father and maternal aunt;  Obesity in her mother.  ROS:   Please see the history of present illness.    All other systems reviewed and are negative.  EKGs/Labs/Other Studies Reviewed:    EKG:  EKG is not ordered today.  The ekg ordered 06/28/20 demonstrates sinus rhythm.  Rate 66 bpm.   Recent Labs: 06/27/2020: Hemoglobin 13.5; Platelets 211 11/11/2020: BUN 11; Creatinine, Ser 0.86; Potassium 4.4; Sodium 142   Recent Lipid Panel    Component Value Date/Time   CHOL 148 11/03/2018 1025   TRIG 75 11/03/2018 1025   HDL 72 11/03/2018 1025   LDLCALC 61 11/03/2018 1025    Physical Exam:    BP 124/70   Ht 5\' 2"  (1.575 m)   Wt 208 lb (94.3 kg)   LMP 12/19/2012   BMI 38.04 kg/m  GENERAL: Well-appearing.  No acute distress. HEENT: Pupils equal round.  Oral mucosa unremarkable NECK:  No jugular venous distention, no visible thyromegaly EXT:  No edema, no cyanosis no clubbing SKIN:  No rashes no nodules NEURO:  Speech fluent.  Cranial nerves grossly intact.  Moves all 4 extremities freely PSYCH:  Cognitively intact, oriented to person place and time  ASSESSMENT:    1. Essential hypertension   2. Obesity (BMI 30-39.9)     PLAN:    # Essential  hypertension: # Obesity: Ms. Hickey has struggled to control her blood pressure.  It has been much better controlled lately and she is tolerating her current regimen.  She was congratulated on her exercise and weight loss efforts through the PREP exercise and nutrition program through the [facility].  She would like more specific guidance on her diet.  We will refer her to the healthy weight and wellness program.   Secondary Causes of Hypertension  Medications/Herbal: OCP, steroids, stimulants, antidepressants, weight loss medication, immune suppressants, NSAIDs, sympathomimetics, alcohol, caffeine, licorice, ginseng, St. John's wort, chemo  Sleep Apnea- sleep study negative 2021 Renal artery stenosis- renal artery Dopplers - 08/2020 Hyperaldosteronism (testing not indicated) Hyper/hypothyroidism- thyroid normal 2019 Pheochromocytoma:  (testing not indicated) Cushing's syndrome: (consider if BP resistant to 3 simultaneous meds) Coarctation of the aorta  (testing not indicated)  # COVID-19 prevention: She has been unable to get the vaccination due to her age of anaphylaxis with the flu shot.  She will continue with handwashing masking.  Disposition:    FU with MD/PharmD in 6 months   Medication Adjustments/Labs and Tests Ordered: Current medicines are reviewed at length with the patient today.  Concerns regarding medicines are outlined above.  Orders Placed This Encounter  Procedures  . Ambulatory referral to [facility]   No orders of the defined types were placed in this encounter.  COVID-19 Education: The signs and symptoms of COVID-19 were discussed with the patient and how to seek care for testing (follow up with PCP or arrange E-visit).  The importance of social distancing was discussed today.  Time:   Today, I have spent 23 minutes with the patient with telehealth technology discussing the above problems.     Signed, [facility], MD  01/02/2021 9:51 AM    [facility]

## 2021-01-04 NOTE — Progress Notes (Signed)
[facility] PREP Weekly Session   Patient Details  Name: Tonya Clarke MRN: [id number] Date of Birth: 05-24-1961 Age: 60 y.o. PCP: Herma Carson, MD  Vitals:   01/04/21 1101  Weight: 208 lb (94.3 kg)     [facility] Weekly seesion - 01/04/21 1100      Weekly Session   Topic Discussed Calorie breakdown    Minutes exercised this week 210 minutes    Classes attended to date 16          Grateful for: blood pressures seems to coming down Barriers/struggles: losing weight, sugar   Pam M Chett Taniguchi 01/04/2021, 11:01 AM

## 2021-01-11 NOTE — Progress Notes (Signed)
[facility] PREP Weekly Session   Patient Details  Name: Tonya Clarke MRN: [id number] Date of Birth: 01-17-1961 Age: 60 y.o. PCP: Cathlean Sauer, MD  Vitals:   01/11/21 1004  Weight: 206 lb (93.4 kg)     [facility] Weekly seesion - 01/11/21 1000      Weekly Session   Topic Discussed Hitting roadblocks    Minutes exercised this week 210 minutes    Classes attended to date 82         Class 01/10/21 at [facility] for: my weight loss- 5lbs Nutrition celebration: grilled salmon and broccoli, olive garden    Barnett Hatter 01/11/2021, 10:07 AM

## 2021-01-20 DIAGNOSIS — E6609 Other obesity due to excess calories: Secondary | ICD-10-CM | POA: Diagnosis not present

## 2021-01-20 DIAGNOSIS — E559 Vitamin D deficiency, unspecified: Secondary | ICD-10-CM | POA: Diagnosis not present

## 2021-01-20 DIAGNOSIS — Z6837 Body mass index (BMI) 37.0-37.9, adult: Secondary | ICD-10-CM | POA: Diagnosis not present

## 2021-01-20 DIAGNOSIS — I10 Essential (primary) hypertension: Secondary | ICD-10-CM | POA: Diagnosis not present

## 2021-01-24 ENCOUNTER — Other Ambulatory Visit: Payer: Self-pay | Admitting: Cardiovascular Disease

## 2021-01-24 DIAGNOSIS — H2640 Unspecified secondary cataract: Secondary | ICD-10-CM | POA: Diagnosis not present

## 2021-01-24 DIAGNOSIS — Q141 Congenital malformation of retina: Secondary | ICD-10-CM | POA: Diagnosis not present

## 2021-01-24 DIAGNOSIS — H401131 Primary open-angle glaucoma, bilateral, mild stage: Secondary | ICD-10-CM | POA: Diagnosis not present

## 2021-01-24 DIAGNOSIS — H04123 Dry eye syndrome of bilateral lacrimal glands: Secondary | ICD-10-CM | POA: Diagnosis not present

## 2021-01-25 NOTE — Progress Notes (Signed)
[facility] PREP Weekly Session   Patient Details  Name: Tonya Clarke MRN: [id number] Date of Birth: 11-13-1961 Age: 60 y.o. PCP: Cathlean Sauer, MD  There were no vitals filed for this visit.   [facility] Weekly seesion - 01/25/21 1500      Weekly Session   Topic Discussed --   final fit test   Minutes exercised this week 210 minutes    Classes attended to date 20          Class 01/24/21 at [facility] things since last meeting: binge watch movies Grateful for: given the opportunity of going thru this program Nutrition celebration: chicken curry Barriers/struggles: releasing fat   Barnett Hatter 01/25/2021, 3:06 PM

## 2021-01-27 NOTE — Progress Notes (Signed)
[facility] PREP Progress Report   Patient Details  Name: ALYONA ROMACK MRN: [id number] Date of Birth: 1961-10-16 Age: 60 y.o. PCP: Cathlean Sauer, MD  Vitals:   01/26/21 1830  BP: 124/88  Pulse: 69  SpO2: 98%  Weight: 204 lb 6.4 oz (92.7 kg)      [facility] Eval - 01/27/21 2000      Measurement   Waist Circumference 44 inches    Hip Circumference 47 inches    Body fat 46 percent      Mobility and Daily Activities   I find it easy to walk up or down two or more flights of stairs. 3    I have no trouble taking out the trash. 4    I do housework such as vacuuming and dusting on my own without difficulty. 3    I can easily lift a gallon of milk (8lbs). 4    I can easily walk a mile. 4    I have no trouble reaching into high cupboards or reaching down to pick up something from the floor. 3    I do not have trouble doing out-door work such as Armed forces logistics/support/administrative officer, raking leaves, or gardening. 3      Mobility and Daily Activities   I feel younger than my age. 2    I feel independent. 4    I feel energetic. 3    I live an active life.  3    I feel strong. 2    I feel healthy. 3    I feel active as other people my age. 3      How fit and strong are you.   Fit and Strong Total Score 44          Past Medical History:  Diagnosis Date  . Acid reflux   . Anemia   . Anxiety   . Chronic back pain   . Chronic SI joint pain   . Constipation   . Essential hypertension 07/25/2020  . Frequent urination   . Glaucoma   . HTN (hypertension)   . Infertility, female   . Leg edema   . No pertinent past medical history   . Obesity (BMI 30-39.9) 07/25/2020  . Right hip pain   . Right leg numbness    Past Surgical History:  Procedure Laterality Date  . BILATERAL SALPINGECTOMY  01/08/2013   Procedure: BILATERAL SALPINGECTOMY;  Surgeon: Marvene Staff, MD;  Location: [facility];  Service: Gynecology;  Laterality: Bilateral;  . CATARACT EXTRACTION    . CESAREAN SECTION    .  DIAGNOSTIC LAPAROSCOPY  1980's  . DILATION AND CURETTAGE OF UTERUS  2011  . ROBOTIC ASSISTED TOTAL HYSTERECTOMY  01/08/2013   Procedure: ROBOTIC ASSISTED TOTAL HYSTERECTOMY;  Surgeon: Marvene Staff, MD;  Location: [facility];  Service: Gynecology;  Laterality: N/A;  3 hrs.  . WISDOM TOOTH EXTRACTION     Social History   Tobacco Use  Smoking Status Former Smoker  . Packs/day: 2.50  . Years: 20.00  . Pack years: 50.00  . Quit date: 01/01/1995  . Years since quitting: 26.0  Smokeless Tobacco Never Used   Marked improvement in cardio, strength and balance Has detailed plans to continue exercise     Barnett Hatter 01/27/2021, 8:40 PM

## 2021-02-18 ENCOUNTER — Other Ambulatory Visit: Payer: Self-pay | Admitting: Cardiovascular Disease

## 2021-02-22 DIAGNOSIS — H26491 Other secondary cataract, right eye: Secondary | ICD-10-CM | POA: Diagnosis not present

## 2021-04-18 ENCOUNTER — Other Ambulatory Visit: Payer: Self-pay | Admitting: Obstetrics and Gynecology

## 2021-04-18 DIAGNOSIS — Z1231 Encounter for screening mammogram for malignant neoplasm of breast: Secondary | ICD-10-CM

## 2021-04-25 ENCOUNTER — Other Ambulatory Visit: Payer: Self-pay | Admitting: Cardiovascular Disease

## 2021-06-07 ENCOUNTER — Other Ambulatory Visit: Payer: Self-pay

## 2021-06-07 ENCOUNTER — Ambulatory Visit
Admission: RE | Admit: 2021-06-07 | Discharge: 2021-06-07 | Disposition: A | Discharge status: Home / Self Care | Payer: BC Managed Care – PPO | Source: Ambulatory Visit | Attending: Obstetrics and Gynecology | Admitting: Obstetrics and Gynecology

## 2021-06-07 DIAGNOSIS — Z1231 Encounter for screening mammogram for malignant neoplasm of breast: Secondary | ICD-10-CM

## 2021-06-09 NOTE — Progress Notes (Signed)
Advanced Hypertension Clinic Follow Up:    Date:  06/12/2021   ID:  Tonya Clarke, DOB [birth year], MRN [id number]  PCP:  Cathlean Sauer, MD  Cardiologist:  None  Nephrologist:  Referring MD: Cathlean Sauer, MD   CC: Hypertension  History of Present Illness:    Tonya Clarke is a 60 y.o. female with a hx of hypertension and prior tobacco abuse here for follow-up.  She was first diagnosed with hypertension in about 2018.  Since then her blood pressure has never really been well-controlled.  She has struggled with intolerances to multiple medications.  She tracks her BP at home.  It has been labile ranging from 107-167/80-100s. On initial assessment she was taking valsartan around 9 AM.  When she started taking it she got dizzy so she reduced to to 80mg . One day the dizziness was so bad that she had to sit still.  She went to the ED 6/28 with dizziness and hypertension.  She has struggled with intolerance to multiple medications.  She was most recently prescribed losartan 160 mg daily but only takes half due to side effects.  She presented to the ED and also had left-sided chest discomfort.  She had a head CT that revealed chronic microvascular disease but was otherwise unremarkable.  She was referred to advanced hypertension clinic for further evaluation.  Since then she saw her PCP 7/21 and given that her BP was still elevated she recommended that she take valsartan 80mg  bid.  She continues to have dizziness at least every other day and skin irritation.   At her initial appointment doxazosin was added and valsartan was reduced to 80 mg. She followed up with our pharmacist and valsartan was switched back to lisinopril.  She last our pharmacist on 09/2020. Her blood pressure is doing better, however she had a cough on lisinopril so she was switched to spironolactone.  She has been feeling well.  Her BP has been 120s/70s lately.  She has been participating in the PREP program through the  [facility]  She has been enjoying the program and feels like she is making some progress. She was referred to [facility] for more nutrition guidance.   Today, she has been feeling fine overall, and has no new cardiovascular complaints. She has completed the PREP program and enjoyed it. Lately she has been stressed since her father had a fall around Christmas. She is a caregiver for him and this is limiting her chances for exercise. She continues to walk but has not been able to do weight training. When she does exercise she feels good with no exertional symptoms. Lately she notes she is slowly gaining some weight back. At home she checks her blood pressure every other day. This morning it was 112/83, and she denies any low blood pressures. She denies any chest pain, shortness of breath, or palpitations. No headaches, lightheadedness, or syncope to report. Also has no lower extremity edema, orthopnea or PND.   Previous antihypertensives: Amlodipine- skin irritation HCTZ- skin burn Lisinopril- sinus drainage Losartan -rash Atenolol Valsartan-dizziness  Past Medical History:  Diagnosis Date   Acid reflux    Anemia    Anxiety    Chronic back pain    Chronic SI joint pain    Constipation    Essential hypertension 07/25/2020   Frequent urination    Glaucoma    HTN (hypertension)    Infertility, female    Leg edema    No pertinent past medical  history    Obesity (BMI 30-39.9) 07/25/2020   Right hip pain    Right leg numbness     Past Surgical History:  Procedure Laterality Date   BILATERAL SALPINGECTOMY  01/08/2013   Procedure: BILATERAL SALPINGECTOMY;  Surgeon: Marvene Staff, MD;  Location: [facility];  Service: Gynecology;  Laterality: Bilateral;   CATARACT EXTRACTION     CESAREAN SECTION     DIAGNOSTIC LAPAROSCOPY  1980's   DILATION AND CURETTAGE OF UTERUS  2011   ROBOTIC ASSISTED TOTAL HYSTERECTOMY  01/08/2013   Procedure: ROBOTIC ASSISTED TOTAL HYSTERECTOMY;  Surgeon:  Marvene Staff, MD;  Location: [facility];  Service: Gynecology;  Laterality: N/A;  3 hrs.   WISDOM TOOTH EXTRACTION      Current Medications: Current Meds  Medication Sig   Cyanocobalamin (VITAMIN B 12 PO) Take 1 capsule by mouth daily.   doxazosin (CARDURA) 2 MG tablet TAKE 1 TABLET BY MOUTH AT BEDTIME   HYDROcodone-acetaminophen (NORCO/VICODIN) 5-325 MG tablet Take 1 tablet by mouth every 6 (six) hours as needed for moderate pain.   Multiple Vitamin (MULTIVITAMIN WITH MINERALS) TABS tablet Take 1 tablet by mouth daily.   olopatadine (PATANOL) 0.1 % ophthalmic solution Place into both eyes 2 (two) times daily.   spironolactone (ALDACTONE) 25 MG tablet TAKE ONE (1) TABLET BY MOUTH EVERY DAY   tiZANidine (ZANAFLEX) 4 MG capsule Take 4 mg by mouth 3 (three) times daily as needed for muscle spasms.   topiramate (TOPAMAX) 50 MG tablet Take 1 tablet (50 mg total) by mouth at bedtime.     Allergies:   Aspirin, Penicillins, Amlodipine, Hydrochlorothiazide, Lisinopril, Red dye, Sulfa antibiotics, Valtrex [valacyclovir], and Losartan   Social History   Socioeconomic History   Marital status: Single    Spouse name: Not on file   Number of children: 1   Years of education: Not on file   Highest education level: Not on file  Occupational History   Occupation: Web designer  Tobacco Use   Smoking status: Former    Packs/day: 2.50    Years: 20.00    Pack years: 50.00    Types: Cigarettes    Quit date: 01/01/1995    Years since quitting: 26.4   Smokeless tobacco: Never  Vaping Use   Vaping Use: Never used  Substance and Sexual Activity   Alcohol use: Yes    Comment: occasional   Drug use: No   Sexual activity: Not on file  Other Topics Concern   Not on file  Social History Narrative   Not on file   Social Determinants of Health   Financial Resource Strain: Not on file  Food Insecurity: Not on file  Transportation Needs: Not on file  Physical Activity: Not on file   Stress: Not on file  Social Connections: Not on file     Family History: The patient's family history includes Breast cancer in her cousin; Cancer in her father and maternal aunt; Obesity in her mother.  ROS:   Please see the history of present illness.    (+) Stress All other systems reviewed and are negative.  EKGs/Labs/Other Studies Reviewed:    EKG:   06/12/2021: Sinus Rhythm. Rate 63 bpm. 01/02/2021: EKG was not ordered.   06/28/20: sinus rhythm.  Rate 66 bpm.   Recent Labs: 06/27/2020: Hemoglobin 13.5; Platelets 211 11/11/2020: BUN 11; Creatinine, Ser 0.86; Potassium 4.4; Sodium 142   Recent Lipid Panel    Component Value Date/Time   CHOL 148 11/03/2018 1025  TRIG 75 11/03/2018 1025   HDL 72 11/03/2018 1025   LDLCALC 61 11/03/2018 1025   US Renal Artery Duplex 09/15/2020: Summary:  Largest Aortic Diameter: 2.2 cm     Renal:  Right: Normal size right kidney. Normal right Resisitive Index.         Normal cortical thickness of right kidney. No evidence of         right renal artery stenosis. RRV flow present.  Left:  Normal size of left kidney. Normal left Resistive Index.         Normal cortical thickness of the left kidney. No evidence of         left renal artery stenosis. LRV flow present.  Mesenteric: Normal Celiac artery and Superior Mesenteric artery findings.  Physical Exam:    VS:  BP 130/78   Pulse 63   Ht 5\' 2"  (1.575 m)   Wt 203 lb 3.2 oz (92.2 kg)   LMP 12/19/2012   BMI 37.17 kg/m  , BMI Body mass index is 37.17 kg/m. GENERAL:  Well appearing HEENT: Pupils equal round and reactive, fundi not visualized, oral mucosa unremarkable NECK:  No jugular venous distention, waveform within normal limits, carotid upstroke brisk and symmetric, no bruits, no thyromegaly LYMPHATICS:  No cervical adenopathy LUNGS:  Clear to auscultation bilaterally HEART:  RRR.  PMI not displaced or sustained,S1 and S2 within normal limits, no S3, no S4, no clicks, no rubs, no  murmurs ABD:  Flat, positive bowel sounds normal in frequency in pitch, no bruits, no rebound, no guarding, no midline pulsatile mass, no hepatomegaly, no splenomegaly EXT:  2 plus pulses throughout, no edema, no cyanosis no clubbing SKIN:  No rashes no nodules NEURO:  Cranial nerves II through XII grossly intact, motor grossly intact throughout PSYCH:  Cognitively intact, oriented to person place and time   ASSESSMENT:    1. Obesity (BMI 30-39.9)   2. Essential hypertension      PLAN:   Essential hypertension Her blood pressure is well-controlled and has been stable despite her life stressors.  Continue current regimen of spironolactone and doxazosin.  The tizanidine is probably also helping with her blood pressure.  At this point she can be discharged from advanced hypertension clinic.  Should she have issues in the future we will be happy to see her at any time.  Obesity (BMI 30-39.9) She was encouraged to get back into a regular exercise routine.     Disposition:    FU with MD/PharmD as needed.   Medication Adjustments/Labs and Tests Ordered: Current medicines are reviewed at length with the patient today.  Concerns regarding medicines are outlined above.  No orders of the defined types were placed in this encounter.  No orders of the defined types were placed in this encounter.  I,Mathew Stumpf,acting as a Education administrator for Skeet Latch, MD.,have documented all relevant documentation on the behalf of Skeet Latch, MD,as directed by  Skeet Latch, MD while in the presence of Skeet Latch, MD.  I, [facility] [employer] [facility], MD have reviewed all documentation for this visit.  The documentation of the exam, diagnosis, procedures, and orders on 06/12/2021 are all accurate and complete.   Signed, Skeet Latch, MD  06/12/2021 4:34 PM     [facility]

## 2021-06-12 ENCOUNTER — Encounter: Payer: Self-pay | Admitting: Cardiovascular Disease

## 2021-06-12 ENCOUNTER — Ambulatory Visit: Payer: BC Managed Care – PPO | Admitting: Cardiovascular Disease

## 2021-06-12 ENCOUNTER — Other Ambulatory Visit: Payer: Self-pay

## 2021-06-12 VITALS — BP 130/78 | HR 63 | Ht 62.0 in | Wt 203.2 lb

## 2021-06-12 DIAGNOSIS — I10 Essential (primary) hypertension: Secondary | ICD-10-CM | POA: Diagnosis not present

## 2021-06-12 DIAGNOSIS — E669 Obesity, unspecified: Secondary | ICD-10-CM

## 2021-06-12 NOTE — Assessment & Plan Note (Addendum)
Her blood pressure is well-controlled and has been stable despite her life stressors.  Continue current regimen of spironolactone and doxazosin.  The tizanidine is probably also helping with her blood pressure.  At this point she can be discharged from advanced hypertension clinic.  Should she have issues in the future we will be happy to see her at any time.

## 2021-06-12 NOTE — Assessment & Plan Note (Signed)
She was encouraged to get back into a regular exercise routine.

## 2021-06-12 NOTE — Patient Instructions (Signed)
Medication Instructions:  Continue current medications  *If you need a refill on your cardiac medications before your next appointment, please call your pharmacy*   Lab Work: None Ordered   Testing/Procedures: None Ordered   Follow-Up: At [facility], you and your health needs are our priority.  As part of our continuing mission to provide you with exceptional heart care, we have created designated Provider Care Teams.  These Care Teams include your primary Cardiologist (physician) and Advanced Practice Providers (APPs -  Physician Assistants and Nurse Practitioners) who all work together to provide you with the care you need, when you need it.  We recommend signing up for the patient portal called "MyChart".  Sign up information is provided on this After Visit Summary.  MyChart is used to connect with patients for Virtual Visits (Telemedicine).  Patients are able to view lab/test results, encounter notes, upcoming appointments, etc.  Non-urgent messages can be sent to your provider as well.   To learn more about what you can do with MyChart, go to [email or web address].    Your next appointment:   As Needed

## 2021-06-23 DIAGNOSIS — Z961 Presence of intraocular lens: Secondary | ICD-10-CM | POA: Diagnosis not present

## 2021-06-23 DIAGNOSIS — H04123 Dry eye syndrome of bilateral lacrimal glands: Secondary | ICD-10-CM | POA: Diagnosis not present

## 2021-06-23 DIAGNOSIS — H401131 Primary open-angle glaucoma, bilateral, mild stage: Secondary | ICD-10-CM | POA: Diagnosis not present

## 2021-06-23 IMAGING — MG DIGITAL SCREENING BILAT W/ CAD
4 series · 4 of 4 positions shown · non-contrast
Comparison: Previous exam(s).

CLINICAL DATA: Screening.

EXAM:
DIGITAL SCREENING BILATERAL MAMMOGRAM WITH CAD
TECHNIQUE: Bilateral screening digital craniocaudal and mediolateral oblique
mammograms were obtained. The images were evaluated with
computer-aided detection.

[L MLO]
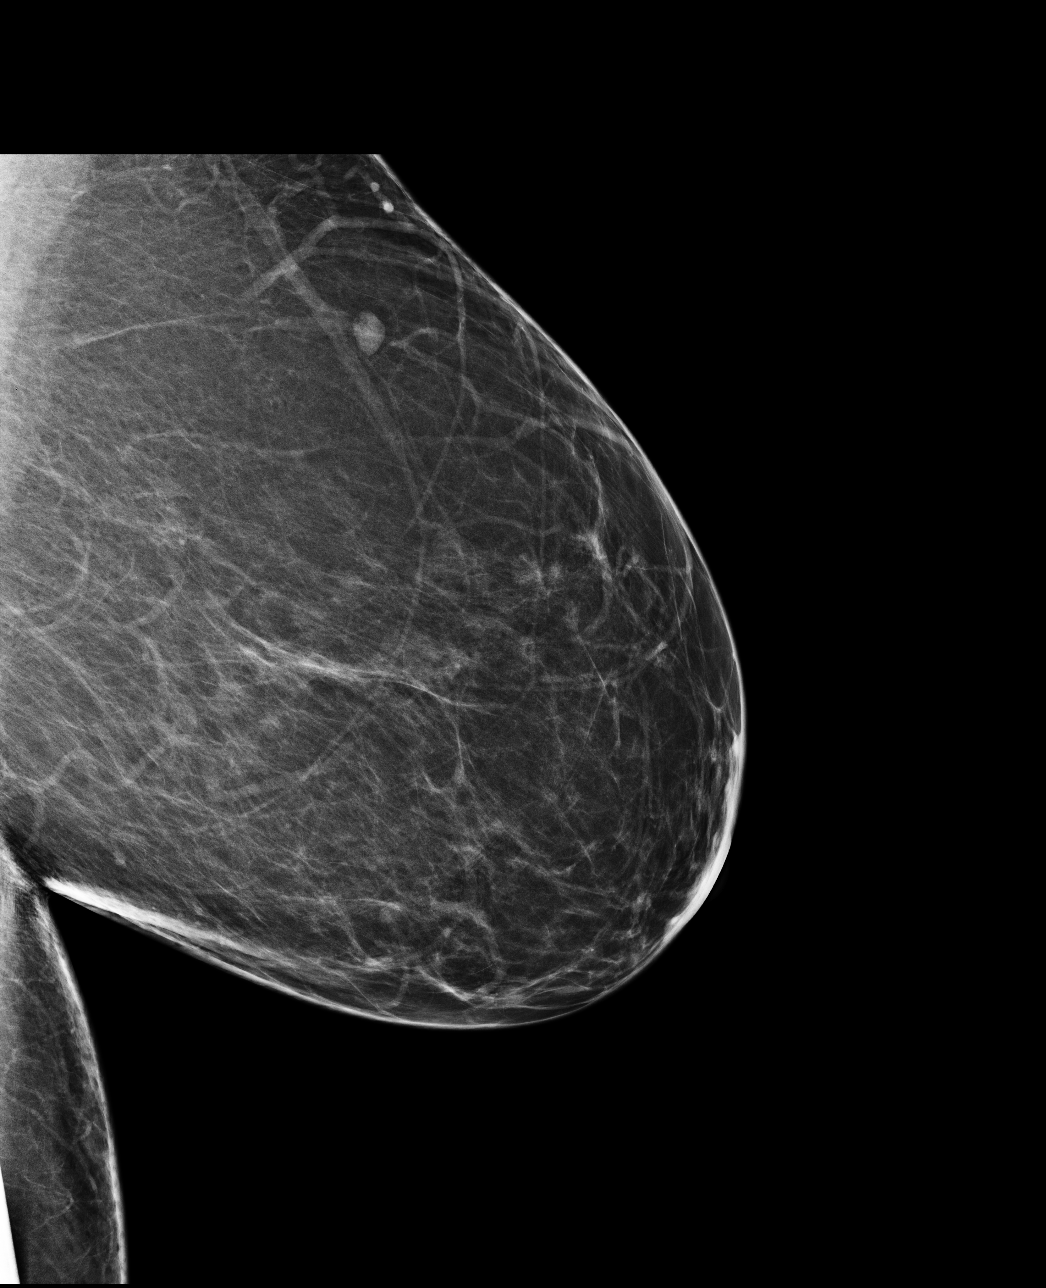

[L CC]
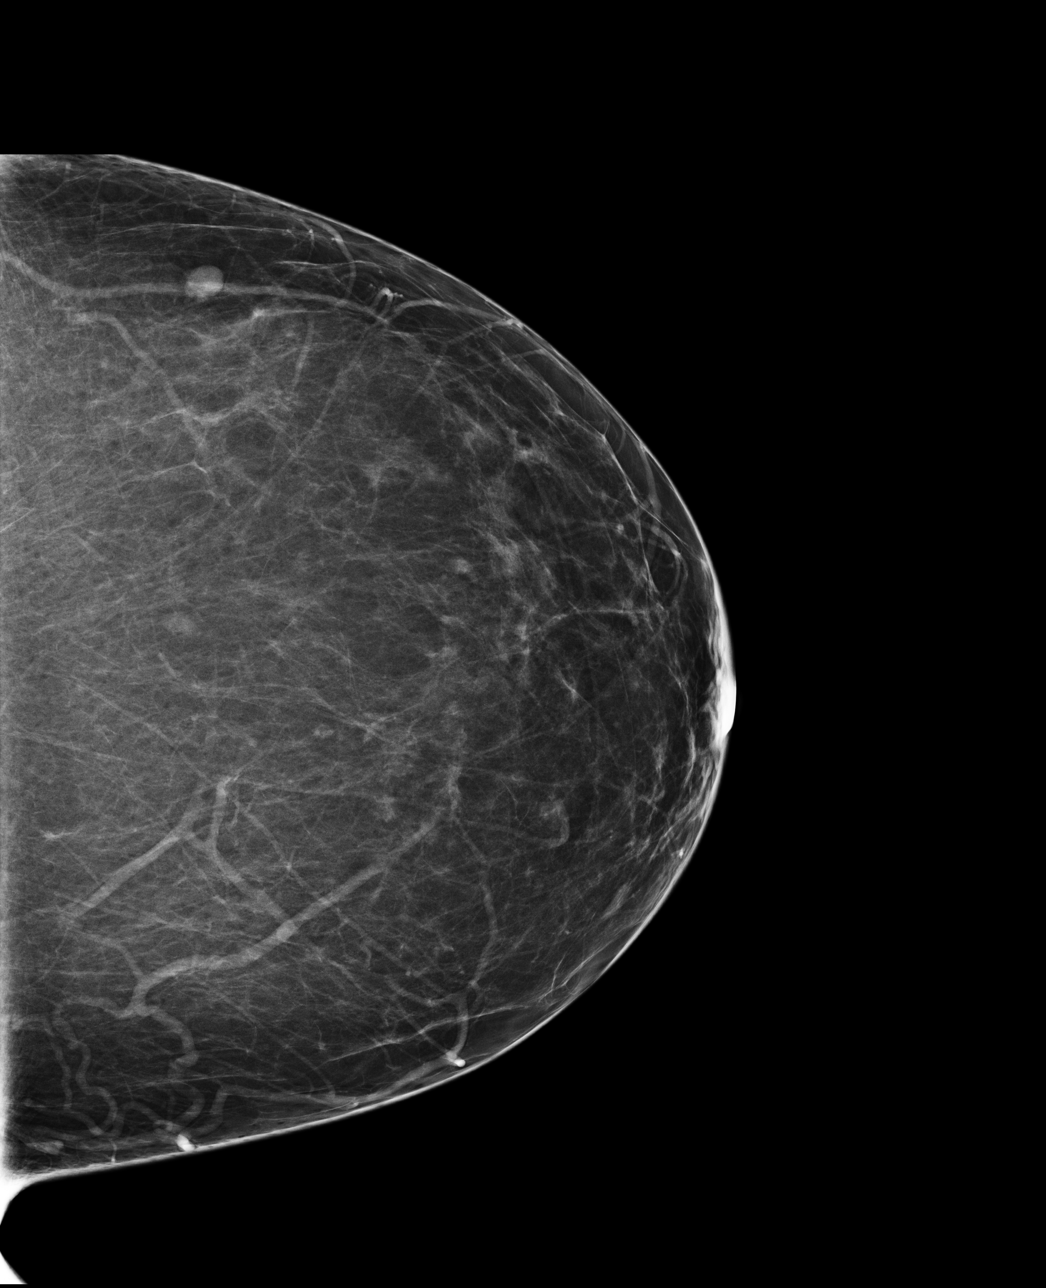

[R MLO]
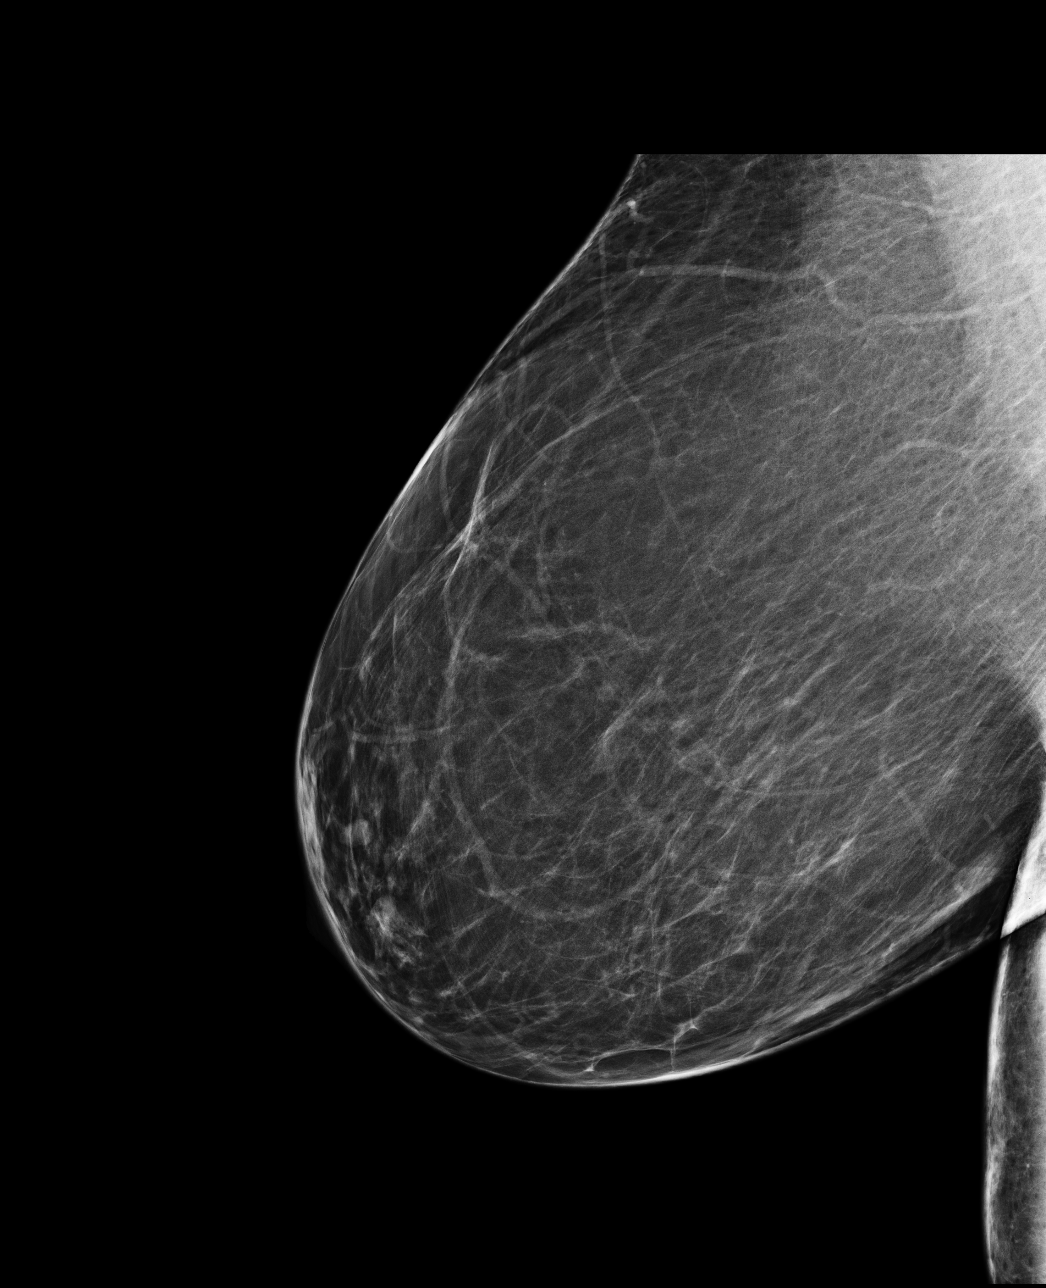

[R CC]
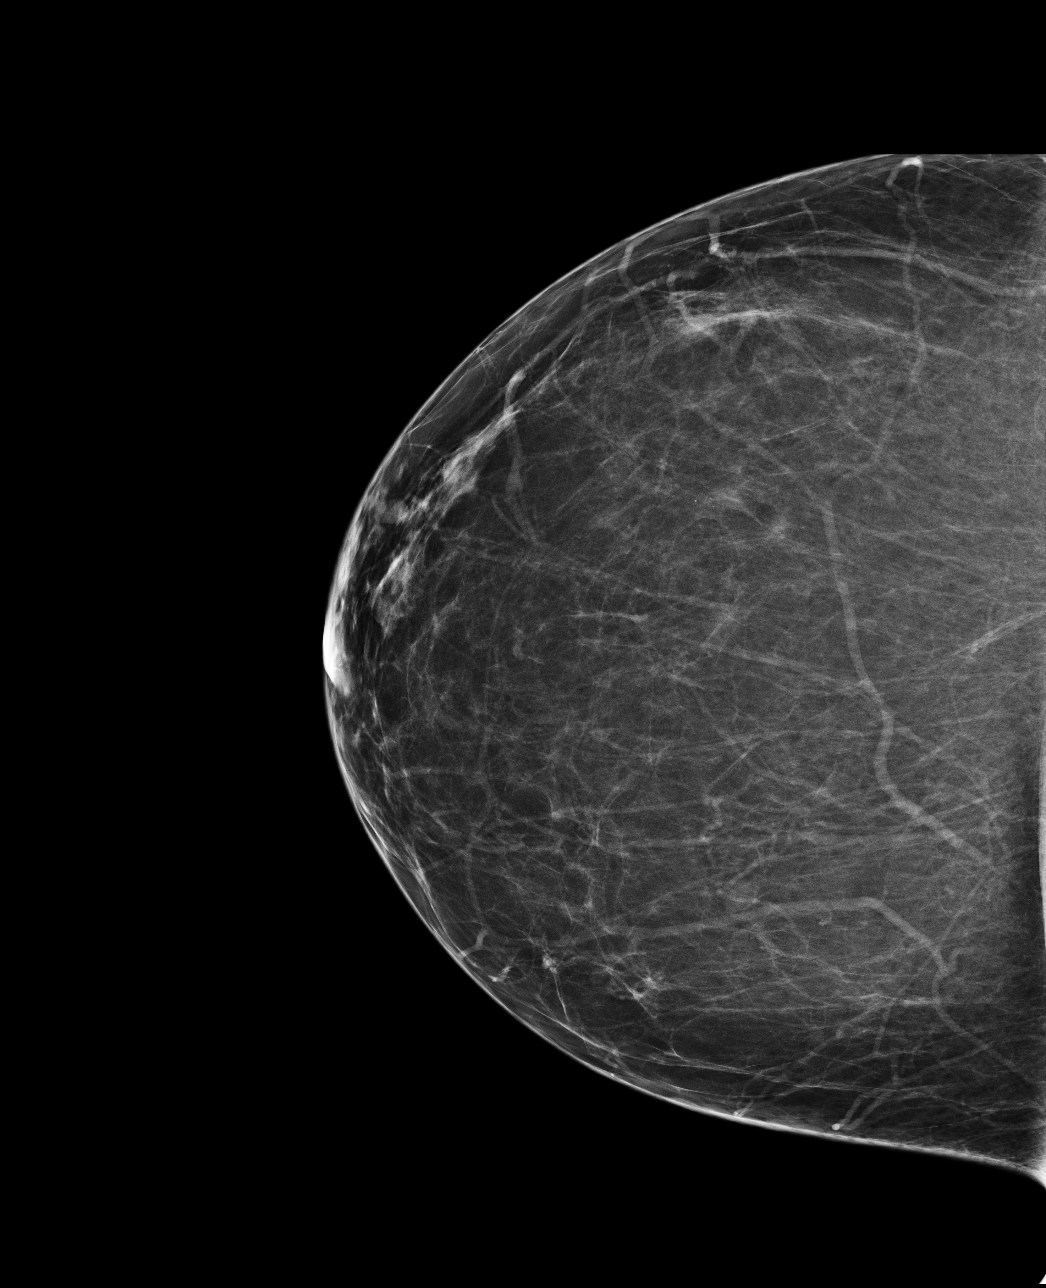

[4 of 4 positions shown; findings below may reference images not displayed]

ACR Breast Density Category b: There are scattered areas of
fibroglandular density.
FINDINGS: There are no findings suspicious for malignancy. The images were
evaluated with computer-aided detection.
IMPRESSION: No mammographic evidence of malignancy. A result letter of this
screening mammogram will be mailed directly to the patient.

RECOMMENDATION:
Screening mammogram in one year. (Code:[id number])

BI-RADS CATEGORY  1: Negative.

## 2021-10-19 ENCOUNTER — Other Ambulatory Visit: Payer: Self-pay | Admitting: Obstetrics and Gynecology

## 2021-10-24 ENCOUNTER — Other Ambulatory Visit: Payer: Self-pay | Admitting: Specialist

## 2021-10-24 ENCOUNTER — Other Ambulatory Visit: Payer: Self-pay | Admitting: Obstetrics and Gynecology

## 2021-10-24 DIAGNOSIS — Z780 Asymptomatic menopausal state: Secondary | ICD-10-CM

## 2022-01-05 ENCOUNTER — Other Ambulatory Visit: Payer: Self-pay | Admitting: Cardiovascular Disease

## 2022-04-05 ENCOUNTER — Ambulatory Visit
Admission: RE | Admit: 2022-04-05 | Discharge: 2022-04-05 | Disposition: A | Discharge status: Home / Self Care | Payer: BC Managed Care – PPO | Source: Ambulatory Visit | Attending: Obstetrics and Gynecology | Admitting: Obstetrics and Gynecology

## 2022-04-05 DIAGNOSIS — Z780 Asymptomatic menopausal state: Secondary | ICD-10-CM

## 2022-06-11 ENCOUNTER — Other Ambulatory Visit: Payer: Self-pay | Admitting: Obstetrics and Gynecology

## 2022-06-11 DIAGNOSIS — Z1231 Encounter for screening mammogram for malignant neoplasm of breast: Secondary | ICD-10-CM

## 2022-06-21 ENCOUNTER — Ambulatory Visit: Payer: BC Managed Care – PPO

## 2022-06-29 ENCOUNTER — Ambulatory Visit: Payer: BC Managed Care – PPO

## 2022-07-11 ENCOUNTER — Ambulatory Visit
Admission: RE | Admit: 2022-07-11 | Discharge: 2022-07-11 | Disposition: A | Discharge status: Home / Self Care | Payer: BC Managed Care – PPO | Source: Ambulatory Visit | Attending: Obstetrics and Gynecology | Admitting: Obstetrics and Gynecology

## 2022-07-11 DIAGNOSIS — Z1231 Encounter for screening mammogram for malignant neoplasm of breast: Secondary | ICD-10-CM

## 2023-01-12 ENCOUNTER — Other Ambulatory Visit: Payer: Self-pay | Admitting: Cardiovascular Disease

## 2023-04-18 ENCOUNTER — Ambulatory Visit (HOSPITAL_BASED_OUTPATIENT_CLINIC_OR_DEPARTMENT_OTHER): Payer: BC Managed Care – PPO | Admitting: Physical Therapy

## 2023-09-11 ENCOUNTER — Other Ambulatory Visit: Payer: Self-pay | Admitting: Obstetrics and Gynecology

## 2023-09-11 DIAGNOSIS — Z1231 Encounter for screening mammogram for malignant neoplasm of breast: Secondary | ICD-10-CM

## 2023-10-03 ENCOUNTER — Ambulatory Visit
Admission: RE | Admit: 2023-10-03 | Discharge: 2023-10-03 | Disposition: A | Discharge status: Home / Self Care | Payer: BC Managed Care – PPO | Source: Ambulatory Visit | Attending: Obstetrics and Gynecology | Admitting: Obstetrics and Gynecology

## 2023-10-03 DIAGNOSIS — Z1231 Encounter for screening mammogram for malignant neoplasm of breast: Secondary | ICD-10-CM

## 2024-09-16 ENCOUNTER — Other Ambulatory Visit: Payer: Self-pay | Admitting: Obstetrics and Gynecology

## 2024-09-16 DIAGNOSIS — Z0000 Encounter for general adult medical examination without abnormal findings: Secondary | ICD-10-CM

## 2024-10-05 ENCOUNTER — Ambulatory Visit
Admission: RE | Admit: 2024-10-05 | Discharge: 2024-10-05 | Disposition: A | Discharge status: Home / Self Care | Source: Ambulatory Visit | Attending: Obstetrics and Gynecology | Admitting: Obstetrics and Gynecology

## 2024-10-05 DIAGNOSIS — Z0000 Encounter for general adult medical examination without abnormal findings: Secondary | ICD-10-CM
